# Patient Record
Sex: Female | Born: 1965 | Race: White | Hispanic: No | Marital: Married | Smoking: Former smoker
Health system: Southern US, Community
[De-identification: ages and names within clinical notes are randomized; demographics above are authoritative.]

## PROBLEM LIST (undated history)

## (undated) DIAGNOSIS — G43909 Migraine, unspecified, not intractable, without status migrainosus: Secondary | ICD-10-CM

## (undated) DIAGNOSIS — F319 Bipolar disorder, unspecified: Secondary | ICD-10-CM

## (undated) DIAGNOSIS — M1990 Unspecified osteoarthritis, unspecified site: Secondary | ICD-10-CM

## (undated) DIAGNOSIS — J302 Other seasonal allergic rhinitis: Secondary | ICD-10-CM

## (undated) DIAGNOSIS — J45909 Unspecified asthma, uncomplicated: Secondary | ICD-10-CM

## (undated) HISTORY — PX: ABLATION: SHX5711

## (undated) HISTORY — PX: KNEE SURGERY: SHX244

---

## 1997-09-09 ENCOUNTER — Encounter (HOSPITAL_COMMUNITY): Admission: RE | Admit: 1997-09-09 | Discharge: 1997-12-08 | Payer: Self-pay

## 1997-10-29 ENCOUNTER — Ambulatory Visit (HOSPITAL_COMMUNITY): Admission: RE | Admit: 1997-10-29 | Discharge: 1997-10-29 | Payer: Self-pay | Admitting: Psychiatry

## 2003-08-12 ENCOUNTER — Other Ambulatory Visit: Admission: RE | Admit: 2003-08-12 | Discharge: 2003-08-12 | Payer: Self-pay | Admitting: Obstetrics and Gynecology

## 2003-08-12 ENCOUNTER — Other Ambulatory Visit: Admission: RE | Admit: 2003-08-12 | Discharge: 2003-08-12 | Payer: Self-pay | Admitting: Student

## 2004-02-18 ENCOUNTER — Inpatient Hospital Stay (HOSPITAL_COMMUNITY): Admission: AD | Admit: 2004-02-18 | Discharge: 2004-02-18 | Payer: Self-pay | Admitting: Obstetrics & Gynecology

## 2004-02-26 ENCOUNTER — Inpatient Hospital Stay (HOSPITAL_COMMUNITY): Admission: AD | Admit: 2004-02-26 | Discharge: 2004-02-29 | Payer: Self-pay | Admitting: Obstetrics and Gynecology

## 2004-03-01 ENCOUNTER — Encounter: Admission: RE | Admit: 2004-03-01 | Discharge: 2004-03-31 | Payer: Self-pay | Admitting: Obstetrics and Gynecology

## 2004-04-11 ENCOUNTER — Other Ambulatory Visit: Admission: RE | Admit: 2004-04-11 | Discharge: 2004-04-11 | Payer: Self-pay | Admitting: Obstetrics and Gynecology

## 2005-07-23 ENCOUNTER — Other Ambulatory Visit: Admission: RE | Admit: 2005-07-23 | Discharge: 2005-07-23 | Payer: Self-pay | Admitting: Obstetrics and Gynecology

## 2008-02-06 ENCOUNTER — Ambulatory Visit: Payer: Self-pay | Admitting: Gastroenterology

## 2011-01-03 ENCOUNTER — Emergency Department: Payer: Self-pay | Admitting: Emergency Medicine

## 2011-01-06 ENCOUNTER — Observation Stay: Payer: Self-pay | Admitting: Internal Medicine

## 2012-10-21 ENCOUNTER — Ambulatory Visit: Payer: Self-pay | Admitting: Gastroenterology

## 2012-11-24 ENCOUNTER — Emergency Department: Payer: Self-pay | Admitting: Emergency Medicine

## 2012-12-19 IMAGING — US US CAROTID DUPLEX BILAT
1 series · 17 of 24 positions shown · non-contrast
Comparison: None

REASON FOR EXAM: dizziness
COMMENTS:

PROCEDURE:     US  - US CAROTID DOPPLER BILATERAL  - January 06, 2011  [DATE]
RESULT:     Indication: Dizziness
TECHNIQUE: Gray-scale, color Doppler, and spectral Doppler images were
obtained of the extracranial carotid artery systems and vertebral arteries
in the neck.

[Series 1: us carotid duplex bilat · 17 of 53 slices shown]
[im 1/53]
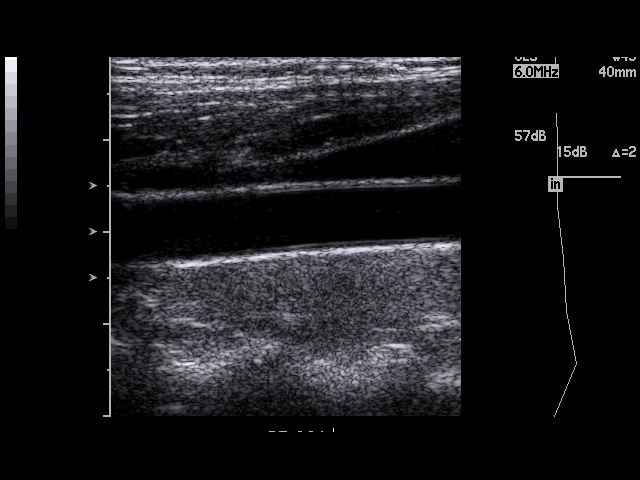
[im 5/53]
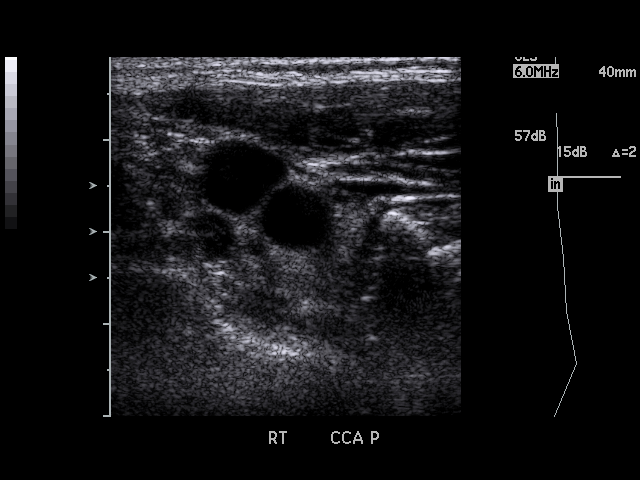
[im 7/53]
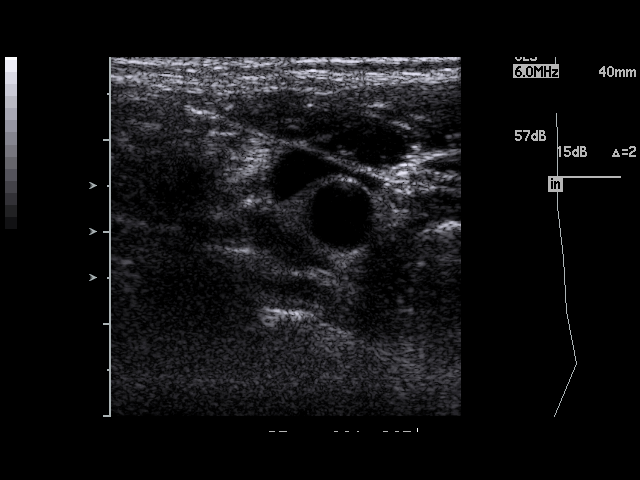
[im 10/53]
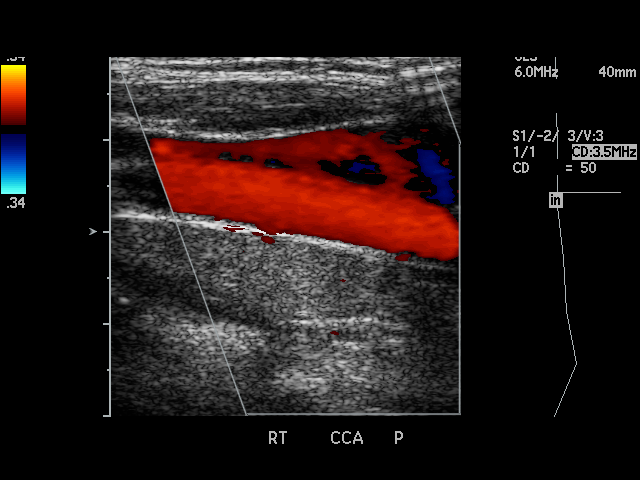
[im 14/53]
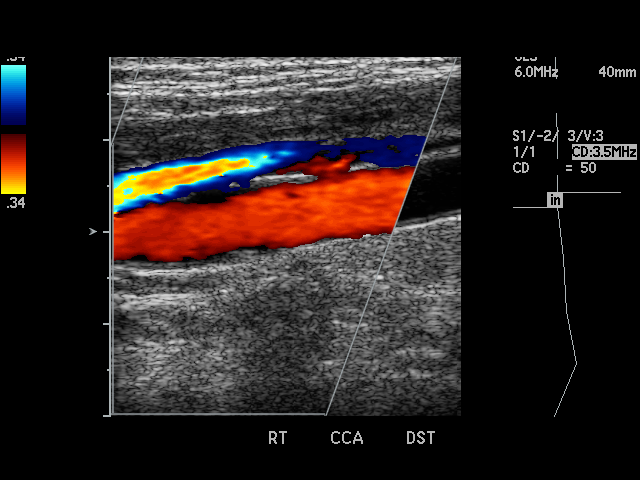
[im 16/53]
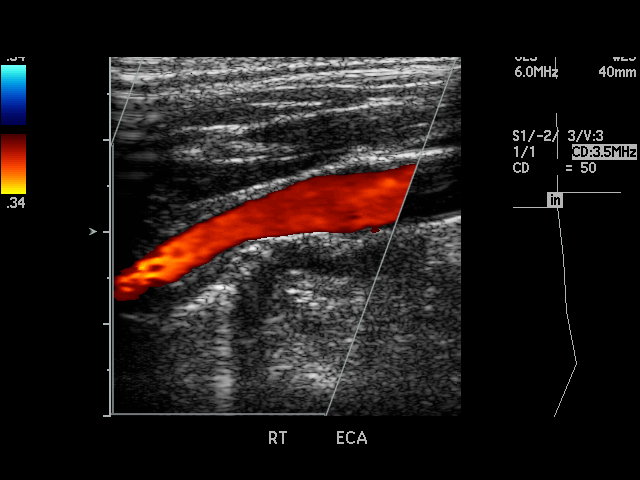
[im 21/53]
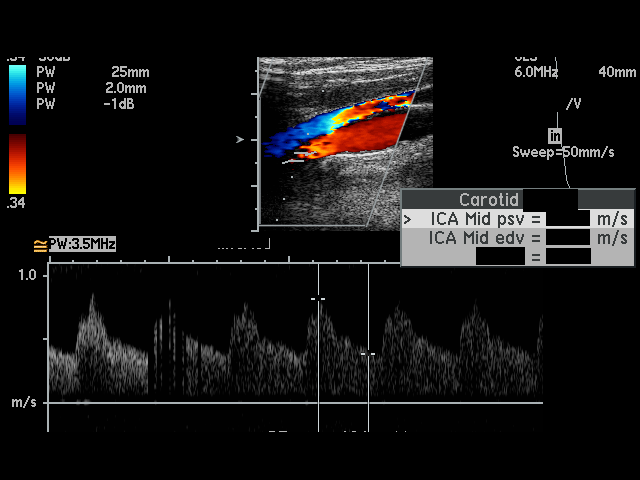
[im 23/53]
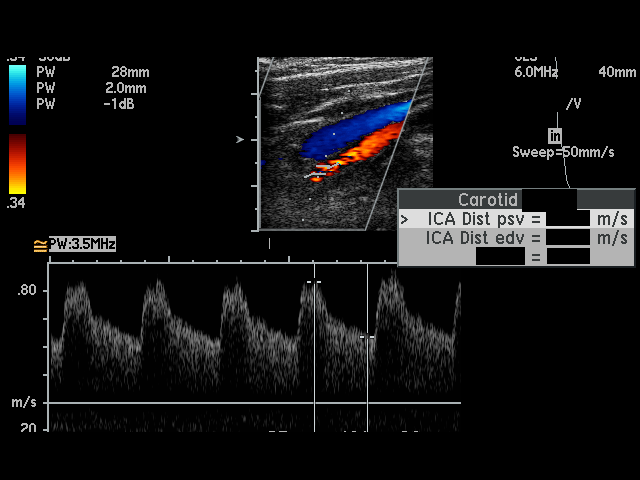
[im 28/53]
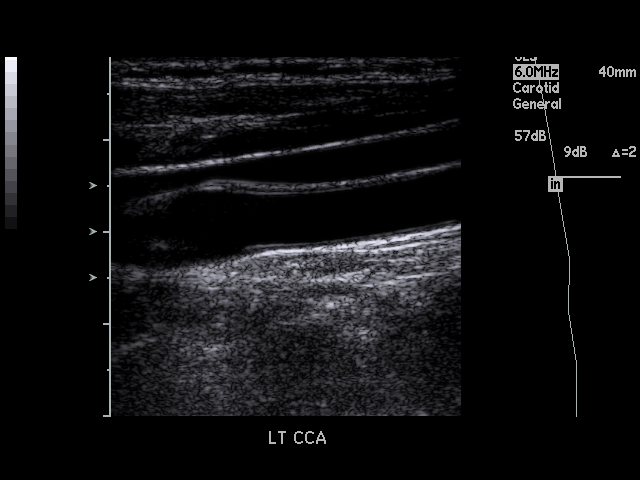
[im 30/53]
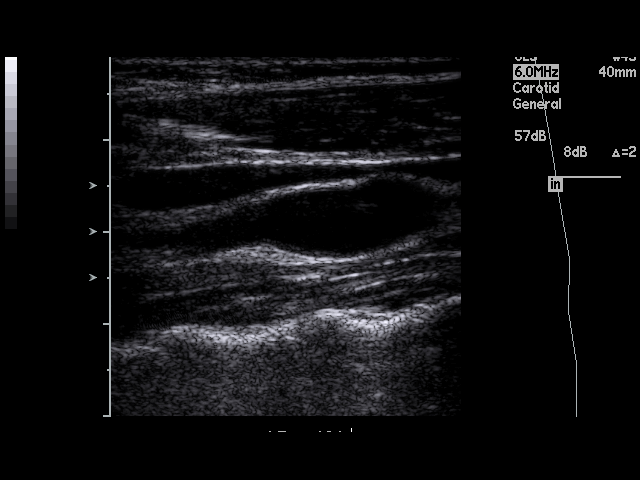
[im 32/53]
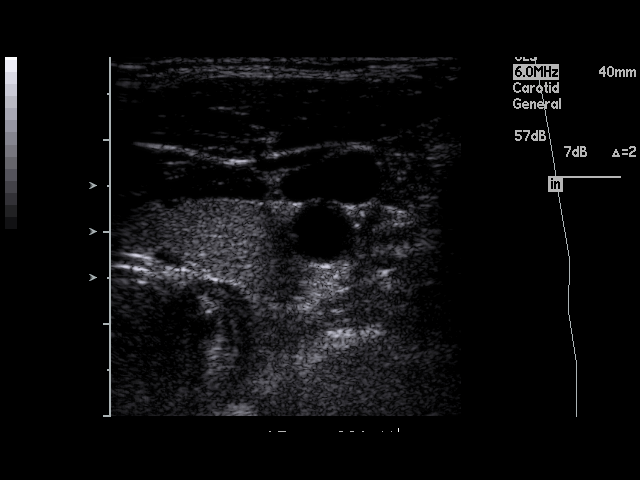
[im 37/53]
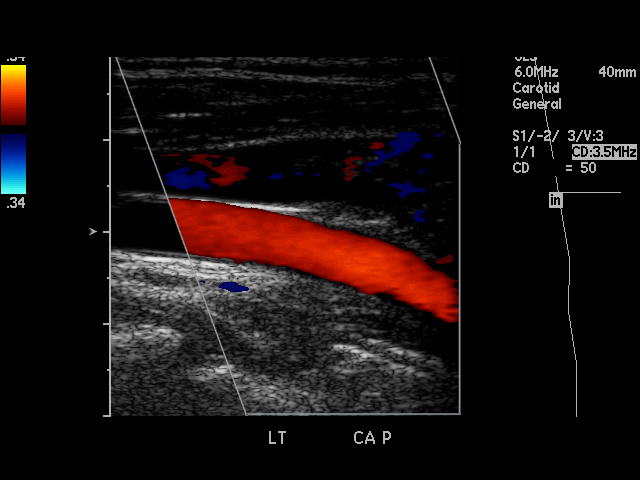
[im 39/53]
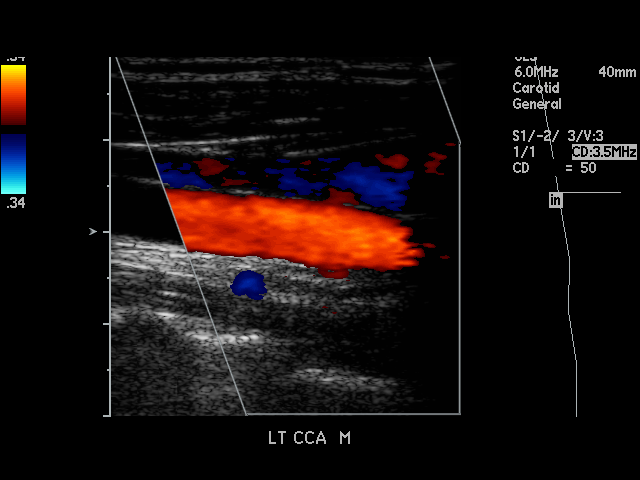
[im 43/53]
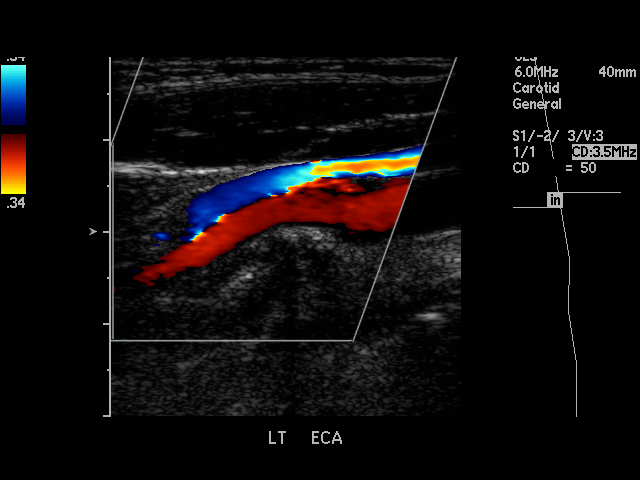
[im 46/53]
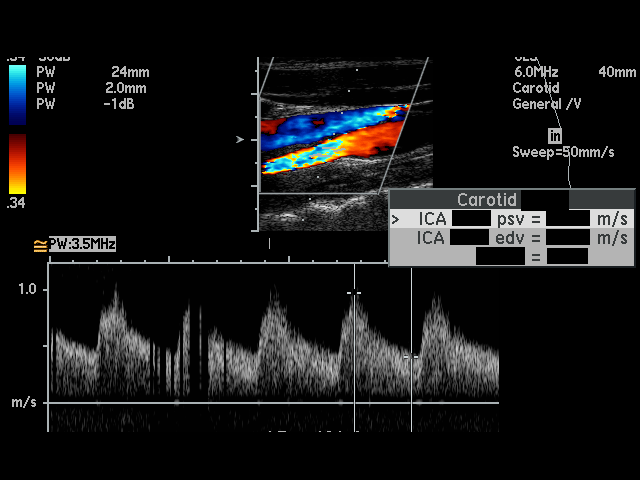
[im 48/53]
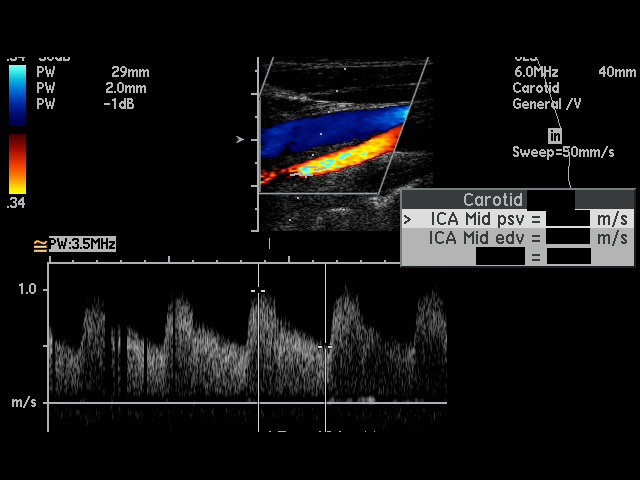
[im 53/53]
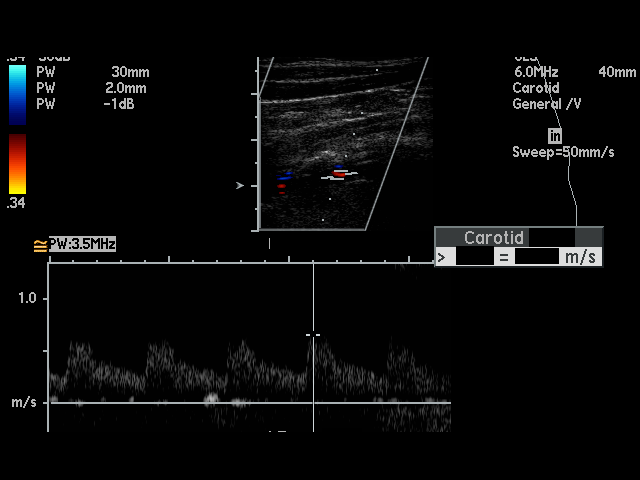

[17 of 24 positions shown; findings below may reference images not displayed]

FINDINGS: On the right, there is no significant atherosclerotic plaque.. Maximum peak
systolic velocity in the right CCA is 84 cm/second. Maximum peak systolic
velocity in the right ICA is 87 cm/second. Maximum peak systolic velocity in
the right ECA is 63 cm/second. The right ICA/CCA ratio is 1.04. This
corresponds to a stenosis of less than 50 %. Antegrade blood flow is
documented in the right vertebral artery.

On the left, there is no significant atherosclerotic plaque. Maximum peak
systolic velocity in the left CCA is 84 cm/second. Maximum peak systolic
velocity in the left ICA is 100 cm/second. Maximum peak systolic velocity in
the left ECA is 66 cm/second. The left ICA/CCA ratio is 1.19. This
corresponds to a stenosis of less than 50 %. Antegrade blood flow is
documented in the left vertebral artery.
IMPRESSION: 1. No hemodynamically significant carotid artery stenosis.

## 2015-05-04 ENCOUNTER — Ambulatory Visit: Payer: Self-pay | Admitting: Physician Assistant

## 2015-05-04 ENCOUNTER — Encounter: Payer: Self-pay | Admitting: Physician Assistant

## 2015-05-04 VITALS — BP 100/65 | HR 84 | Temp 97.9°F

## 2015-05-04 DIAGNOSIS — J069 Acute upper respiratory infection, unspecified: Secondary | ICD-10-CM

## 2015-05-04 MED ORDER — ALBUTEROL SULFATE HFA 108 (90 BASE) MCG/ACT IN AERS: 2.0000 | INHALATION_SPRAY | Freq: Four times a day (QID) | RESPIRATORY_TRACT | Status: DC | PRN

## 2015-05-04 MED ORDER — BENZONATATE 200 MG PO CAPS: 200.0000 mg | ORAL_CAPSULE | Freq: Three times a day (TID) | ORAL | Status: DC | PRN

## 2015-05-04 MED ORDER — CEFDINIR 300 MG PO CAPS: 300.0000 mg | ORAL_CAPSULE | Freq: Two times a day (BID) | ORAL | Status: DC

## 2015-05-04 NOTE — Progress Notes (Signed)
S: C/o runny nose and congestion for 7-10 days, no fever, chills, cp/sob, v/d; mucus was yellow, cough is sporadic, voice is hoarse; daughter had same sx  Using otc meds: robitussin  O: PE: perrl eomi, normocephalic, tms dull, nasal mucosa red and swollen on r side, boggy on left, throat injected, neck supple no lymph, lungs c t a, cv rrr, neuro intact, cough is dry  A:  Acute uri   P: omnicef 300mg  bid x 10d, albuterol inhaler, tessalon perls; drink fluids, continue regular meds , use otc meds of choice, return if not improving in 5 days, return earlier if worsening

## 2015-05-18 ENCOUNTER — Ambulatory Visit: Payer: Self-pay | Admitting: Physician Assistant

## 2015-05-18 ENCOUNTER — Encounter: Payer: Self-pay | Admitting: Physician Assistant

## 2015-05-18 VITALS — BP 100/60 | HR 99 | Temp 98.5°F

## 2015-05-18 DIAGNOSIS — H6981 Other specified disorders of Eustachian tube, right ear: Secondary | ICD-10-CM

## 2015-05-18 MED ORDER — PREDNISONE 10 MG PO TABS: 30.0000 mg | ORAL_TABLET | Freq: Every day | ORAL | Status: DC

## 2015-05-18 NOTE — Progress Notes (Signed)
S:  C/o ears popping and being stopped up, no drainage from ears, no fever/chills, no cough or congestion, some sinus pressure, remainder ros neg Using otc meds without relief  O:  Vitals wnl, nad, tms dull b/l, nasal mucosa swollen, throat wnl, neck supple no lymph, lungs c t a, cv rrr, neuro intact  A: acute eustachean tube dysfunction  P: flonase, sudafed, prednisone 30mg qd x 3d, return if not improving in 3 to 5 days, return earlier if worsening  

## 2015-10-03 ENCOUNTER — Emergency Department
Admission: EM | Admit: 2015-10-03 | Discharge: 2015-10-03 | Disposition: A | Discharge status: Home / Self Care | Payer: Worker's Compensation | Attending: Emergency Medicine | Admitting: Emergency Medicine

## 2015-10-03 ENCOUNTER — Encounter: Payer: Self-pay | Admitting: Emergency Medicine

## 2015-10-03 DIAGNOSIS — Y92410 Unspecified street and highway as the place of occurrence of the external cause: Secondary | ICD-10-CM | POA: Diagnosis not present

## 2015-10-03 DIAGNOSIS — Y9389 Activity, other specified: Secondary | ICD-10-CM | POA: Insufficient documentation

## 2015-10-03 DIAGNOSIS — Z79899 Other long term (current) drug therapy: Secondary | ICD-10-CM | POA: Diagnosis not present

## 2015-10-03 DIAGNOSIS — S39011A Strain of muscle, fascia and tendon of abdomen, initial encounter: Secondary | ICD-10-CM | POA: Insufficient documentation

## 2015-10-03 DIAGNOSIS — R1084 Generalized abdominal pain: Secondary | ICD-10-CM | POA: Diagnosis present

## 2015-10-03 DIAGNOSIS — F319 Bipolar disorder, unspecified: Secondary | ICD-10-CM | POA: Diagnosis not present

## 2015-10-03 DIAGNOSIS — Y999 Unspecified external cause status: Secondary | ICD-10-CM | POA: Insufficient documentation

## 2015-10-03 DIAGNOSIS — M549 Dorsalgia, unspecified: Secondary | ICD-10-CM | POA: Insufficient documentation

## 2015-10-03 DIAGNOSIS — Z7952 Long term (current) use of systemic steroids: Secondary | ICD-10-CM | POA: Insufficient documentation

## 2015-10-03 DIAGNOSIS — J45909 Unspecified asthma, uncomplicated: Secondary | ICD-10-CM | POA: Diagnosis not present

## 2015-10-03 HISTORY — DX: Migraine, unspecified, not intractable, without status migrainosus: G43.909

## 2015-10-03 HISTORY — DX: Bipolar disorder, unspecified: F31.9

## 2015-10-03 HISTORY — DX: Other seasonal allergic rhinitis: J30.2

## 2015-10-03 HISTORY — DX: Unspecified osteoarthritis, unspecified site: M19.90

## 2015-10-03 HISTORY — DX: Unspecified asthma, uncomplicated: J45.909

## 2015-10-03 MED ORDER — TRAMADOL HCL 50 MG PO TABS: 50.0000 mg | ORAL_TABLET | Freq: Four times a day (QID) | ORAL | Status: DC | PRN

## 2015-10-03 MED ORDER — DIAZEPAM 5 MG PO TABS
5.0000 mg | ORAL_TABLET | Freq: Three times a day (TID) | ORAL | Status: DC | PRN
Start: 2015-10-03 — End: 2016-03-02

## 2015-10-03 NOTE — Discharge Instructions (Signed)
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
## 2016-03-02 ENCOUNTER — Ambulatory Visit: Payer: Self-pay | Admitting: Physician Assistant

## 2016-03-02 ENCOUNTER — Encounter: Payer: Self-pay | Admitting: Physician Assistant

## 2016-03-02 VITALS — BP 124/58 | HR 84 | Temp 97.7°F

## 2016-03-02 DIAGNOSIS — F319 Bipolar disorder, unspecified: Secondary | ICD-10-CM | POA: Insufficient documentation

## 2016-03-02 DIAGNOSIS — J4520 Mild intermittent asthma, uncomplicated: Secondary | ICD-10-CM

## 2016-03-02 MED ORDER — METHYLPREDNISOLONE 4 MG PO TBPK: ORAL_TABLET | ORAL | 0 refills | Status: DC

## 2016-03-02 MED ORDER — MONTELUKAST SODIUM 10 MG PO TABS: 10.0000 mg | ORAL_TABLET | Freq: Every day | ORAL | 3 refills | Status: DC

## 2016-03-02 NOTE — Progress Notes (Signed)
S: C/o cough  with wheezing, chest is sore from coughing, states a coworker has had fresh flowers at her desk and it set her asthma off, using inhaler with a little relief, denies fever, chills, cough is dry and hacking; keeping pt awake at night;  denies cardiac type chest pain or sob, v/d, abd pain Remainder ros neg  O: vitals wnl, nad, tms clear, throat injected, neck supple no lymph, lungs c t a, cv rrr, neuro intact  A:  Acute bronchitis   P:  rx medication: medrol dose pack, singulair, use otc meds, tylenol or motrin as needed for fever/chills, return if not better in 3 -5 days, return earlier if worsening

## 2016-06-07 ENCOUNTER — Ambulatory Visit: Payer: Self-pay | Admitting: Physician Assistant

## 2016-06-07 VITALS — BP 120/57 | HR 91 | Temp 98.3°F

## 2016-06-07 DIAGNOSIS — J019 Acute sinusitis, unspecified: Secondary | ICD-10-CM

## 2016-06-07 DIAGNOSIS — J069 Acute upper respiratory infection, unspecified: Secondary | ICD-10-CM

## 2016-06-07 MED ORDER — LEVOFLOXACIN 500 MG PO TABS: 500.0000 mg | ORAL_TABLET | Freq: Every day | ORAL | 0 refills | Status: DC

## 2016-06-07 NOTE — Progress Notes (Signed)
S 4 d hx of scratchy throat ,increased pnd , cough productive of green yellow mucous, body aches, frontal HA worse with bending over, ears stopped up, pressure , low grade fever , h/0 asthma and has used her inhalers due to tightness  O/ VSS mildly ill NAD ENT  Nasal turbinates red ,swollen with exudate , throat injected neck supple without nodes, heart rsr lungs clear  A/ URI ,rhinosinusitis Asthma  P . Supportive measures discussed. Follow up prn not improving  She has inhalers on hand.  rx levaquin 500  One qd # 10 . Hydration encouraged .

## 2016-06-27 ENCOUNTER — Other Ambulatory Visit: Payer: Self-pay | Admitting: Physician Assistant

## 2016-10-26 ENCOUNTER — Encounter: Payer: Self-pay | Admitting: Physician Assistant

## 2016-10-26 ENCOUNTER — Ambulatory Visit: Payer: Self-pay | Admitting: Physician Assistant

## 2016-10-26 VITALS — BP 119/70 | HR 84 | Temp 98.5°F

## 2016-10-26 DIAGNOSIS — I889 Nonspecific lymphadenitis, unspecified: Secondary | ICD-10-CM

## 2016-10-26 MED ORDER — CEPHALEXIN 500 MG PO CAPS: 500.0000 mg | ORAL_CAPSULE | Freq: Three times a day (TID) | ORAL | 0 refills | Status: DC

## 2016-10-26 NOTE — Progress Notes (Signed)
S: c/o large knot behind left ear, her hair dresser noticed it, did put a warm compress on it and ?if its a little better, also has a ?node on left side of her back, has been there about 6 months-1 year; thought it was a fat deposit, also has a reddish rash on her right breast, had the "blue light" treatment at the dermatologist and this showed up later, denies fever/chills/cp/sob/abd pain/cough  O: vitals wnl, nad, postauricular area with large node noted, no cuts or lesions in scalp, no pustules noted, node is a little tender to palp, neck supple no lymph noted, no axillary lymph nodes noted, area on left side of back approximate to si joint is swollen, tender mobile, feels like a swollen node, n/v intact  A: lymphadenitis  P: keflex 500mg  tid, refer to surgery for eval of swollen nodes

## 2016-10-30 ENCOUNTER — Encounter: Payer: Self-pay | Admitting: *Deleted

## 2016-10-30 NOTE — Progress Notes (Signed)
Patient has been referred to see Dr. Jamal Collin at [facility] on 11/01/16 @ 8:45am.  Patient has been notified and has accepted the appointment.

## 2016-11-01 ENCOUNTER — Ambulatory Visit (INDEPENDENT_AMBULATORY_CARE_PROVIDER_SITE_OTHER): Payer: Managed Care, Other (non HMO) | Admitting: General Surgery

## 2016-11-01 ENCOUNTER — Encounter: Payer: Self-pay | Admitting: General Surgery

## 2016-11-01 VITALS — BP 120/78 | HR 73 | Resp 12 | Ht 61.0 in | Wt 140.0 lb

## 2016-11-01 DIAGNOSIS — D170 Benign lipomatous neoplasm of skin and subcutaneous tissue of head, face and neck: Secondary | ICD-10-CM | POA: Diagnosis not present

## 2016-11-01 DIAGNOSIS — D171 Benign lipomatous neoplasm of skin and subcutaneous tissue of trunk: Secondary | ICD-10-CM

## 2016-11-01 DIAGNOSIS — D1779 Benign lipomatous neoplasm of other sites: Secondary | ICD-10-CM

## 2016-11-01 NOTE — Patient Instructions (Signed)
The patient is aware to call back for any questions or concerns. Patient to return as needed. 

## 2016-11-01 NOTE — Progress Notes (Signed)
Patient ID: Kayla Knox, female   DOB: April 13, 1966, 51 y.o.   MRN: [id number]  Chief Complaint  Patient presents with  . Other    lymphadenitis     HPI Kayla Knox is a 51 y.o. female here today for a evaluation of lymphadenitis. Patient states she has noticed some knots behind her ear and on her left side of her back about six months ago. She recently had a rash on her right breast. Patient saw her OBGYN on Monday they gave her a cream and the rash is almost gone.  HPI  Past Medical History:  Diagnosis Date  . Arthritis   . Asthma   . Bipolar 1 disorder ([place])   . Migraines   . Seasonal allergies     Past Surgical History:  Procedure Laterality Date  . KNEE SURGERY      Family History  Problem Relation Age of Onset  . Colon cancer Maternal Uncle     Social History Social History  Substance Use Topics  . Smoking status: Never Smoker  . Smokeless tobacco: Never Used  . Alcohol use No    Allergies  Allergen Reactions  . Promethazine Other (See Comments)    Seizure, hospitalized because of this medication   . Azithromycin Rash  . Penicillin G Rash    Current Outpatient Prescriptions  Medication Sig Dispense Refill  . albuterol (PROVENTIL HFA;VENTOLIN HFA) 108 (90 BASE) MCG/ACT inhaler Inhale 2 puffs into the lungs every 6 (six) hours as needed for wheezing or shortness of breath. 1 Inhaler 6  . cephALEXin (KEFLEX) 500 MG capsule Take 1 capsule (500 mg total) by mouth 3 (three) times daily. 21 capsule 0  . divalproex (DEPAKOTE ER) 250 MG 24 hr tablet Take 250 mg by mouth at bedtime.  3  . lamoTRIgine (LAMICTAL) 100 MG tablet Take by mouth.    . levofloxacin (LEVAQUIN) 500 MG tablet Take 1 tablet (500 mg total) by mouth daily. 10 tablet 0  . montelukast (SINGULAIR) 10 MG tablet TAKE 1 TABLET (10 MG TOTAL) BY MOUTH AT BEDTIME. 30 tablet 3   No current facility-administered medications for this visit.     Review of Systems Review of Systems  Constitutional:  Negative.   Respiratory: Negative.   Cardiovascular: Negative.     Blood pressure 120/78, pulse 73, resp. rate 12, height 5\' 1"  (1.549 m), weight 140 lb (63.5 kg).  Physical Exam Physical Exam  Constitutional: She is oriented to person, place, and time. She appears well-developed and well-nourished.  Eyes: Conjunctivae are normal.  Neck: Neck supple.    Cardiovascular: Normal rate and regular rhythm.   Lymphadenopathy:    She has no cervical adenopathy.  Neurological: She is alert and oriented to person, place, and time.  Skin: Skin is warm and dry.       Data Reviewed Notes reviewed   Assessment    Skin cyst behind left ear Multiple small lipomas as noted above.  None of these are symptomatic. Pt advised. To call for any associated pain, redness or rapid enlargement      Plan    Patient to return as needed.   HPI, Physical Exam, Assessment and Plan have been scribed under the direction and in the presence of Mckinley Jewel, MD  Gaspar Cola, CMA     I have completed the exam and reviewed the above documentation for accuracy and completeness.  I agree with the above.  Haematologist has been used and any errors  in dictation or transcription are unintentional.  Seeplaputhur G. Jamal Collin, M.D., F.A.C.S.    Junie Panning G 11/01/2016, 9:23 AM

## 2016-12-17 ENCOUNTER — Ambulatory Visit: Payer: Self-pay | Admitting: Physician Assistant

## 2016-12-17 ENCOUNTER — Ambulatory Visit
Admission: RE | Admit: 2016-12-17 | Discharge: 2016-12-17 | Disposition: A | Discharge status: Home / Self Care | Payer: Managed Care, Other (non HMO) | Source: Ambulatory Visit | Attending: Physician Assistant | Admitting: Physician Assistant

## 2016-12-17 ENCOUNTER — Encounter: Payer: Self-pay | Admitting: Physician Assistant

## 2016-12-17 VITALS — BP 119/70 | HR 94 | Temp 98.5°F | Resp 16

## 2016-12-17 DIAGNOSIS — M25552 Pain in left hip: Secondary | ICD-10-CM

## 2016-12-17 DIAGNOSIS — W19XXXA Unspecified fall, initial encounter: Secondary | ICD-10-CM | POA: Diagnosis not present

## 2016-12-17 MED ORDER — MELOXICAM 15 MG PO TABS: 15.0000 mg | ORAL_TABLET | Freq: Every day | ORAL | 0 refills | Status: DC

## 2016-12-17 MED ORDER — BACLOFEN 10 MG PO TABS: 10.0000 mg | ORAL_TABLET | Freq: Three times a day (TID) | ORAL | 0 refills | Status: DC
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
## 2016-12-31 ENCOUNTER — Encounter: Payer: Self-pay | Admitting: Physician Assistant

## 2016-12-31 ENCOUNTER — Ambulatory Visit: Payer: Self-pay | Admitting: Physician Assistant

## 2016-12-31 VITALS — BP 110/70 | HR 87 | Temp 97.8°F | Resp 16

## 2016-12-31 DIAGNOSIS — J0120 Acute ethmoidal sinusitis, unspecified: Secondary | ICD-10-CM

## 2016-12-31 MED ORDER — SULFAMETHOXAZOLE-TRIMETHOPRIM 800-160 MG PO TABS: 1.0000 | ORAL_TABLET | Freq: Two times a day (BID) | ORAL | 0 refills | Status: DC

## 2016-12-31 MED ORDER — PSEUDOEPH-BROMPHEN-DM 30-2-10 MG/5ML PO SYRP: 5.0000 mL | ORAL_SOLUTION | Freq: Four times a day (QID) | ORAL | 0 refills | Status: DC | PRN

## 2016-12-31 MED ORDER — MONTELUKAST SODIUM 10 MG PO TABS: 10.0000 mg | ORAL_TABLET | Freq: Every day | ORAL | 3 refills | Status: DC

## 2016-12-31 NOTE — Progress Notes (Signed)
    Subjective: Sinus congestion/mediation refill    Patient ID: Kayla Knox, female    DOB: 04-12-1966, 51 y.o.   MRN: [id number]  HPI Patient c/o right sinus and ear pressure for one week. Intermitting fever. Also c/o non-productive cough. States increase use of Inhaler. Request refill of Singulair.   Review of Systems Bipolar    Objective:   Physical Exam Right maxillary guarading and edematous right TM. Post nasal drainage. Non-productive cough. Negative cervical adenopathy and neck is supple. Lungs CTA and Heart RRR.       Assessment & Plan:Sinusitis and medication refill  Bactrim DS, Bromfed DM, and Singulair.  Follow up one week if no improvement.

## 2017-01-12 ENCOUNTER — Other Ambulatory Visit: Payer: Self-pay | Admitting: Physician Assistant

## 2017-01-14 NOTE — Telephone Encounter (Signed)
Med refill for mobic approved 

## 2017-08-02 ENCOUNTER — Emergency Department
Admission: EM | Admit: 2017-08-02 | Discharge: 2017-08-02 | Disposition: A | Discharge status: Home / Self Care | Payer: BC Managed Care – PPO | Attending: Emergency Medicine | Admitting: Emergency Medicine

## 2017-08-02 ENCOUNTER — Other Ambulatory Visit: Payer: Self-pay

## 2017-08-02 DIAGNOSIS — K219 Gastro-esophageal reflux disease without esophagitis: Secondary | ICD-10-CM | POA: Insufficient documentation

## 2017-08-02 DIAGNOSIS — F419 Anxiety disorder, unspecified: Secondary | ICD-10-CM | POA: Insufficient documentation

## 2017-08-02 DIAGNOSIS — J45909 Unspecified asthma, uncomplicated: Secondary | ICD-10-CM | POA: Diagnosis not present

## 2017-08-02 DIAGNOSIS — T7840XA Allergy, unspecified, initial encounter: Secondary | ICD-10-CM

## 2017-08-02 DIAGNOSIS — R0989 Other specified symptoms and signs involving the circulatory and respiratory systems: Secondary | ICD-10-CM | POA: Insufficient documentation

## 2017-08-02 MED ORDER — PREDNISONE 20 MG PO TABS
60.0000 mg | ORAL_TABLET | Freq: Once | ORAL | Status: AC
Start: 2017-08-02 — End: 2017-08-02
  Administered 2017-08-02: 60 mg via ORAL
  Filled 2017-08-02: qty 3

## 2017-08-02 MED ORDER — DIPHENHYDRAMINE HCL 25 MG PO CAPS
25.0000 mg | ORAL_CAPSULE | Freq: Once | ORAL | Status: DC
  Filled 2017-08-02: qty 1

## 2017-08-02 MED ORDER — PREDNISONE 20 MG PO TABS
ORAL_TABLET | ORAL | Status: AC
  Filled 2017-08-02: qty 2

## 2017-08-02 MED ORDER — PREDNISONE 10 MG (21) PO TBPK: ORAL_TABLET | Freq: Every day | ORAL | 0 refills | Status: DC

## 2017-08-02 NOTE — ED Notes (Signed)
Pt presents today for allergic reaction. Started amoxicillin yesterday

## 2017-08-02 NOTE — ED Triage Notes (Signed)
Pt states she took first dose of amoxicillin within the past 53min and feels like her throat is swollen, states it does not feel right when I swallow. Unsure if she has ever had that med before.

## 2017-08-02 NOTE — ED Notes (Signed)
No swelling seen to throat.

## 2017-08-02 NOTE — ED Provider Notes (Signed)
[facility] Emergency Department Provider Note       Time seen: ----------------------------------------- 6:10 PM on 08/02/2017 -----------------------------------------   I have reviewed the triage vital signs and the nursing notes.  HISTORY   Chief Complaint Allergic Reaction    HPI Kayla Knox is a 52 y.o. female with a history of arthritis, asthma, bipolar disorder, migraines and seasonal allergies who presents to the ED for possible allergic reaction.  Patient states she took her first dose of amoxicillin which she was prescribed for sinusitis.  She states within the last 20 minutes she has felt like her throat was closing but otherwise denies any allergic symptoms.  She was allergic to penicillin as a child.  Past Medical History:  Diagnosis Date  . Arthritis   . Asthma   . Bipolar 1 disorder ([place])   . Migraines   . Seasonal allergies     Patient Active Problem List   Diagnosis Date Noted  . Bipolar 1 disorder ([place]) 03/02/2016    Past Surgical History:  Procedure Laterality Date  . KNEE SURGERY      Allergies Promethazine; Azithromycin; and Penicillin g  Social History Social History   Tobacco Use  . Smoking status: Never Smoker  . Smokeless tobacco: Never Used  Substance Use Topics  . Alcohol use: No    Alcohol/week: 0.0 oz  . Drug use: No    Review of Systems Constitutional: Negative for fever. Eyes: Negative for vision changes ENT: Positive for dysphasia Cardiovascular: Negative for chest pain. Respiratory: Negative for shortness of breath. Gastrointestinal: Negative for abdominal pain, vomiting and diarrhea. Genitourinary: Negative for dysuria. Musculoskeletal: Negative for back pain. Skin: Negative for rash. Neurological: Negative for headaches, focal weakness or numbness.  All systems negative/normal/unremarkable except as stated in the HPI  ____________________________________________   PHYSICAL  EXAM:  VITAL SIGNS: ED Triage Vitals  Enc Vitals Group     BP 08/02/17 1755 (!) 165/88     Pulse Rate 08/02/17 1755 (!) 109     Resp 08/02/17 1755 18     Temp 08/02/17 1755 98.3 F (36.8 C)     Temp Source 08/02/17 1755 Oral     SpO2 08/02/17 1755 100 %     Weight 08/02/17 1802 145 lb (65.8 kg)     Height 08/02/17 1802 5' 1.5" (1.562 m)     Head Circumference --      Peak Flow --      Pain Score --      Pain Loc --      Pain Edu? --      Excl. in [place]? --     Constitutional: Alert and oriented. Well appearing and in no distress. Eyes: Conjunctivae are normal. Normal extraocular movements. ENT   Head: Normocephalic and atraumatic.   Nose: No congestion/rhinnorhea.   Mouth/Throat: Mucous membranes are moist.  No swelling is appreciated   Neck: No stridor. Cardiovascular: Normal rate, regular rhythm. No murmurs, rubs, or gallops. Respiratory: Normal respiratory effort without tachypnea nor retractions. Breath sounds are clear and equal bilaterally. No wheezes/rales/rhonchi. Gastrointestinal: Soft and nontender. Normal bowel sounds Musculoskeletal: Nontender with normal range of motion in extremities. No lower extremity tenderness nor edema. Neurologic:  Normal speech and language. No gross focal neurologic deficits are appreciated.  Skin:  Skin is warm, dry and intact. No rash noted. Psychiatric: Mood and affect are normal. Speech and behavior are normal.  ____________________________________________  ED COURSE:  As part of my medical decision making, I  reviewed the following data within the [place] History obtained from family if available, nursing notes, old chart and ekg, as well as notes from prior ED visits. Patient presented for possible allergic reaction, we will give Benadryl and oral steroids.  The remainder of her examination is normal.  We will observe her in the ER.    Procedures ____________________________________________  DIFFERENTIAL DIAGNOSIS   Allergic reaction, anxiety, anaphylaxis, GERD  FINAL ASSESSMENT AND PLAN  Possible allergic reaction, sinusitis   Plan: Patient had presented for possible allergic reaction.  She has not had any worsening in her symptoms and is stable for outpatient follow-up.  I will advise steroids for her sinuses alone, as well as for her possible reaction.   Laurence Aly, MD   Note: This note was generated in part or whole with voice recognition software. Voice recognition is usually quite accurate but there are transcription errors that can and very often do occur. I apologize for any typographical errors that were not detected and corrected.     Earleen Newport, MD 08/02/17 [phone]

## 2017-08-02 NOTE — ED Triage Notes (Signed)
First Nurse Note:  One dose Amoxicillin taken approximately 15 minutes PTA.  Patient states she feels throat is closing up.  Voice clear and strong. Respirations regular and non labored.  NAD.

## 2018-03-19 ENCOUNTER — Ambulatory Visit
Admission: RE | Admit: 2018-03-19 | Discharge: 2018-03-19 | Disposition: A | Discharge status: Home / Self Care | Payer: BC Managed Care – PPO | Source: Ambulatory Visit | Attending: Allergy | Admitting: Allergy

## 2018-03-19 ENCOUNTER — Other Ambulatory Visit: Payer: Self-pay | Admitting: Allergy

## 2018-03-19 DIAGNOSIS — R05 Cough: Secondary | ICD-10-CM

## 2018-03-19 DIAGNOSIS — R059 Cough, unspecified: Secondary | ICD-10-CM

## 2018-04-07 ENCOUNTER — Encounter: Payer: Self-pay | Admitting: Emergency Medicine

## 2018-04-21 ENCOUNTER — Ambulatory Visit: Payer: Self-pay | Admitting: Psychiatry

## 2018-05-05 ENCOUNTER — Other Ambulatory Visit: Payer: Self-pay

## 2018-05-05 MED ORDER — DIVALPROEX SODIUM ER 250 MG PO TB24: 250.0000 mg | ORAL_TABLET | Freq: Every day | ORAL | 0 refills | Status: DC

## 2018-06-09 ENCOUNTER — Ambulatory Visit: Payer: BC Managed Care – PPO | Admitting: Psychiatry

## 2018-06-09 ENCOUNTER — Encounter: Payer: Self-pay | Admitting: Psychiatry

## 2018-06-09 DIAGNOSIS — F3181 Bipolar II disorder: Secondary | ICD-10-CM

## 2018-06-09 MED ORDER — DIVALPROEX SODIUM ER 250 MG PO TB24: 250.0000 mg | ORAL_TABLET | Freq: Every day | ORAL | 3 refills | Status: DC

## 2018-06-09 MED ORDER — LAMOTRIGINE 100 MG PO TABS: 100.0000 mg | ORAL_TABLET | Freq: Every day | ORAL | 3 refills | Status: DC

## 2018-06-09 MED ORDER — DIVALPROEX SODIUM 500 MG PO DR TAB: 1000.0000 mg | DELAYED_RELEASE_TABLET | Freq: Every day | ORAL | 3 refills | Status: DC

## 2018-06-09 NOTE — Progress Notes (Signed)
Kayla Knox [id number] Jan 08, 1966 52 y.o.  Subjective:   Patient ID:  Kayla Knox is a 52 y.o. (DOB 09-18-1965) female.  Chief Complaint:  Chief Complaint  Patient presents with  . Follow-up    Medication Management    HPI Kayla Knox presents to the office today for follow-up of bipolar disorder.  Patient reports stable mood and denies depressed or irritable moods.  Patient denies any recent difficulty with anxiety.  Patient denies difficulty with sleep initiation or maintenance. Denies appetite disturbance.  Patient reports that energy and motivation have been good.  Patient denies any difficulty with concentration.  Patient denies any suicidal ideation. A little more moody with PMDD and perimenopausal with hot flashes.  2 biggest triggers stress and not enough sleep.      Review of Systems:  Review of Systems  Respiratory: Positive for cough.   Neurological: Negative for tremors and weakness.  Psychiatric/Behavioral: Negative for agitation, behavioral problems, confusion, decreased concentration, dysphoric mood, hallucinations, self-injury, sleep disturbance and suicidal ideas. The patient is not nervous/anxious and is not hyperactive.     Medications: I have reviewed the patient's current medications.  Current Outpatient Medications  Medication Sig Dispense Refill  . Albuterol Sulfate (PROAIR RESPICLICK) 409 (90 Base) MCG/ACT AEPB Inhale into the lungs.    . divalproex (DEPAKOTE ER) 250 MG 24 hr tablet Take 1 tablet (250 mg total) by mouth at bedtime. 90 tablet 3  . divalproex (DEPAKOTE) 500 MG DR tablet Take 2 tablets (1,000 mg total) by mouth at bedtime. 180 tablet 3  . fluticasone (FLONASE) 50 MCG/ACT nasal spray Place 1 spray into both nostrils daily.    . fluticasone furoate-vilanterol (BREO ELLIPTA) 100-25 MCG/INH AEPB Inhale 1 puff into the lungs daily.    . Hypromellose (ALZAIR ALLERGY NASAL SPRAY NA) Place into the nose.    . lamoTRIgine (LAMICTAL) 100  MG tablet Take 1 tablet (100 mg total) by mouth daily. 90 tablet 3  . loratadine (CLARITIN) 10 MG tablet Take 10 mg by mouth daily.    . montelukast (SINGULAIR) 10 MG tablet TAKE 1 TABLET (10 MG TOTAL) BY MOUTH AT BEDTIME. 30 tablet 3  . albuterol (PROVENTIL HFA;VENTOLIN HFA) 108 (90 BASE) MCG/ACT inhaler Inhale 2 puffs into the lungs every 6 (six) hours as needed for wheezing or shortness of breath. (Patient not taking: Reported on 06/09/2018) 1 Inhaler 6  . baclofen (LIORESAL) 10 MG tablet Take 1 tablet (10 mg total) by mouth 3 (three) times daily. (Patient not taking: Reported on 12/31/2016) 30 each 0  . brompheniramine-pseudoephedrine-DM 30-2-10 MG/5ML syrup Take 5 mLs by mouth 4 (four) times daily as needed. (Patient not taking: Reported on 06/09/2018) 120 mL 0  . meloxicam (MOBIC) 15 MG tablet TAKE 1 TABLET BY MOUTH EVERY DAY (Patient not taking: Reported on 06/09/2018) 30 tablet 12  . predniSONE (STERAPRED UNI-PAK 21 TAB) 10 MG (21) TBPK tablet Take by mouth daily. Dispense steroid taper pack as directed (Patient not taking: Reported on 06/09/2018) 21 tablet 0  . sulfamethoxazole-trimethoprim (BACTRIM DS,SEPTRA DS) 800-160 MG tablet Take 1 tablet by mouth 2 (two) times daily. (Patient not taking: Reported on 06/09/2018) 20 tablet 0   No current facility-administered medications for this visit.     Medication Side Effects: None  Allergies:  Allergies  Allergen Reactions  . Promethazine Other (See Comments)    Seizure, hospitalized because of this medication   . Azithromycin Rash  . Penicillin G Rash    Past Medical History:  Diagnosis Date  . Arthritis   . Asthma   . Bipolar 1 disorder ([place])   . Migraines   . Seasonal allergies     Family History  Problem Relation Age of Onset  . Colon cancer Maternal Uncle     Social History   Socioeconomic History  . Marital status: Married    Spouse name: Not on file  . Number of children: Not on file  . Years of education: Not on  file  . Highest education level: Not on file  Occupational History  . Not on file  Social Needs  . Financial resource strain: Not on file  . Food insecurity:    Worry: Not on file    Inability: Not on file  . Transportation needs:    Medical: Not on file    Non-medical: Not on file  Tobacco Use  . Smoking status: Never Smoker  . Smokeless tobacco: Never Used  Substance and Sexual Activity  . Alcohol use: No    Alcohol/week: 0.0 standard drinks  . Drug use: No  . Sexual activity: Not on file  Lifestyle  . Physical activity:    Days per week: Not on file    Minutes per session: Not on file  . Stress: Not on file  Relationships  . Social connections:    Talks on phone: Not on file    Gets together: Not on file    Attends religious service: Not on file    Active member of club or organization: Not on file    Attends meetings of clubs or organizations: Not on file    Relationship status: Not on file  . Intimate partner violence:    Fear of current or ex partner: Not on file    Emotionally abused: Not on file    Physically abused: Not on file    Forced sexual activity: Not on file  Other Topics Concern  . Not on file  Social History Narrative  . Not on file    Past Medical History, Surgical history, Social history, and Family history were reviewed and updated as appropriate.   Please see review of systems for further details on the patient's review from today.   Objective:   Physical Exam:  There were no vitals taken for this visit.  Physical Exam Constitutional:      General: She is not in acute distress.    Appearance: She is well-developed.  Musculoskeletal:        General: No deformity.  Neurological:     Mental Status: She is alert and oriented to person, place, and time.     Motor: No tremor.     Coordination: Coordination normal.     Gait: Gait normal.  Psychiatric:        Attention and Perception: Attention and perception normal.        Mood and  Affect: Mood is not anxious or depressed. Affect is not labile, blunt, angry or inappropriate.        Speech: Speech normal.        Behavior: Behavior normal.        Thought Content: Thought content normal. Thought content does not include homicidal or suicidal ideation. Thought content does not include homicidal or suicidal plan.        Cognition and Memory: Cognition normal.        Judgment: Judgment normal.     Comments: Insight intact. No auditory or visual hallucinations. No delusions.  Lab Review:  No results found for: NA, K, CL, CO2, GLUCOSE, BUN, CREATININE, CALCIUM, PROT, ALBUMIN, AST, ALT, ALKPHOS, BILITOT, GFRNONAA, GFRAA  No results found for: WBC, RBC, HGB, HCT, PLT, MCV, MCH, MCHC, RDW, LYMPHSABS, MONOABS, EOSABS, BASOSABS  No results found for: POCLITH, LITHIUM   No results found for: PHENYTOIN, PHENOBARB, VALPROATE, CBMZ   .res Assessment: Plan:    Bipolar II disorder, most recent episode major depressive ([place])   Ideal dosage Depakote ER 1250.  Missing some of the 250mg .  Doesn't notice a lot if missing it.  Discussed the risk of dropping the dosage to 1000 mg daily may not result in immediate mood swings but they may occur gradually over a period of weeks or months.  Given that she had been stable and tolerating 1250 mg a day for an extended period of time would recommend that she not stray from that dosage.  Prior blood levels on the same dosage produced a valproic acid level of 76.  Therefore there is not a lot of room to drop and keep benefit.  She agrees  Due to extended period of stability she wishes to wait a year before follow-up.  She is aware of the risk of doing so and will call should she have mood swings  Hiram Comber, MD, DFAPA  Please see After Visit Summary for patient specific instructions.  No future appointments.  No orders of the defined types were placed in this encounter.     -------------------------------

## 2018-11-30 IMAGING — CR DG HIP (WITH OR WITHOUT PELVIS) 2-3V*L*
1 series · 3 of 3 positions shown · non-contrast
Comparison: None.

CLINICAL DATA: Fall 1 month ago with left groin pain. Initial
encounter.

EXAM:
DG HIP (WITH OR WITHOUT PELVIS) 2-3V LEFT

[Series 1: dg hip unilat w or w/o pelvis 2-3 views  · non-contrast · 0.14mm/px · 3 of 3 slices shown]
[im 1/3]
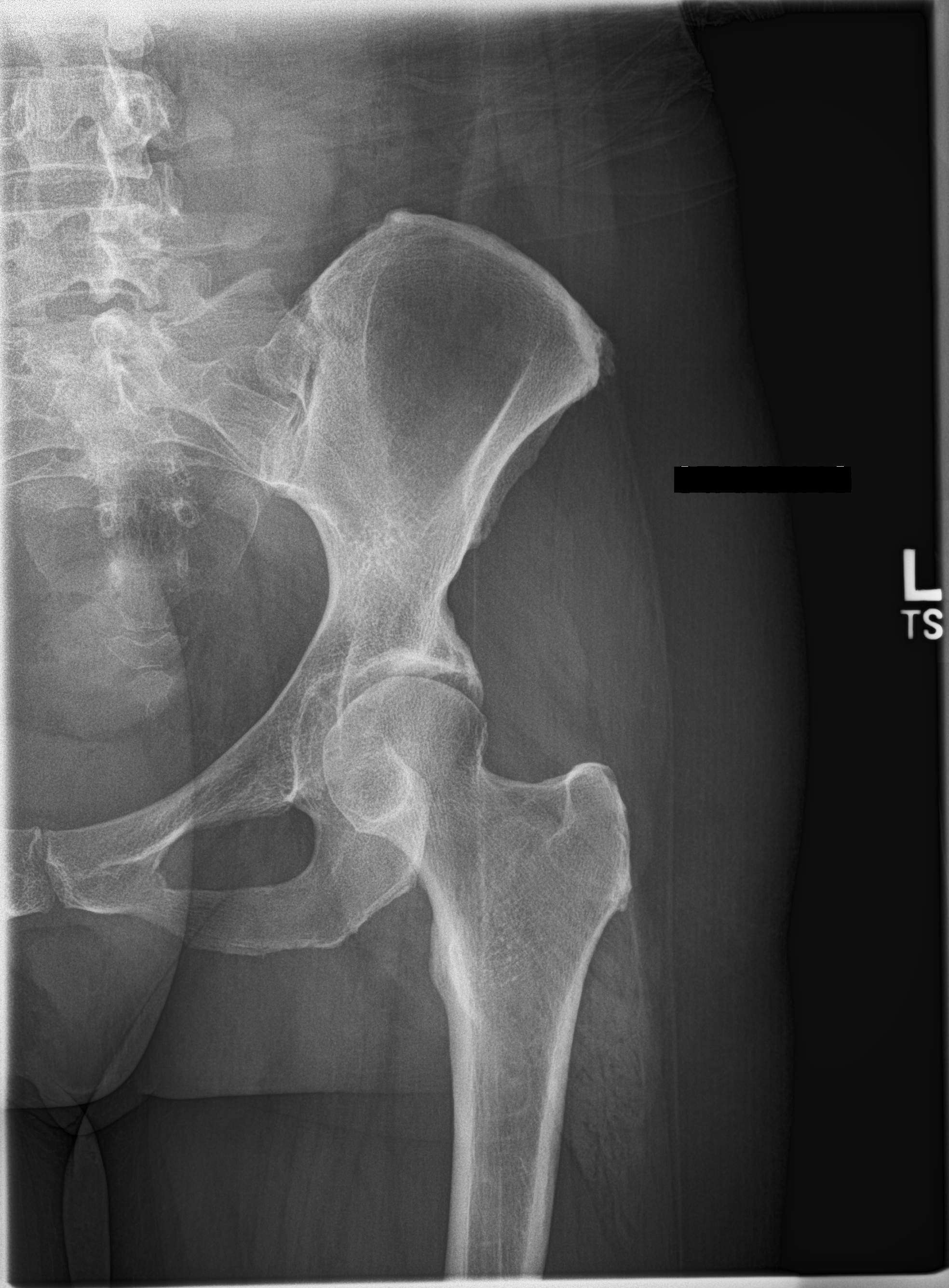
[im 2/3]
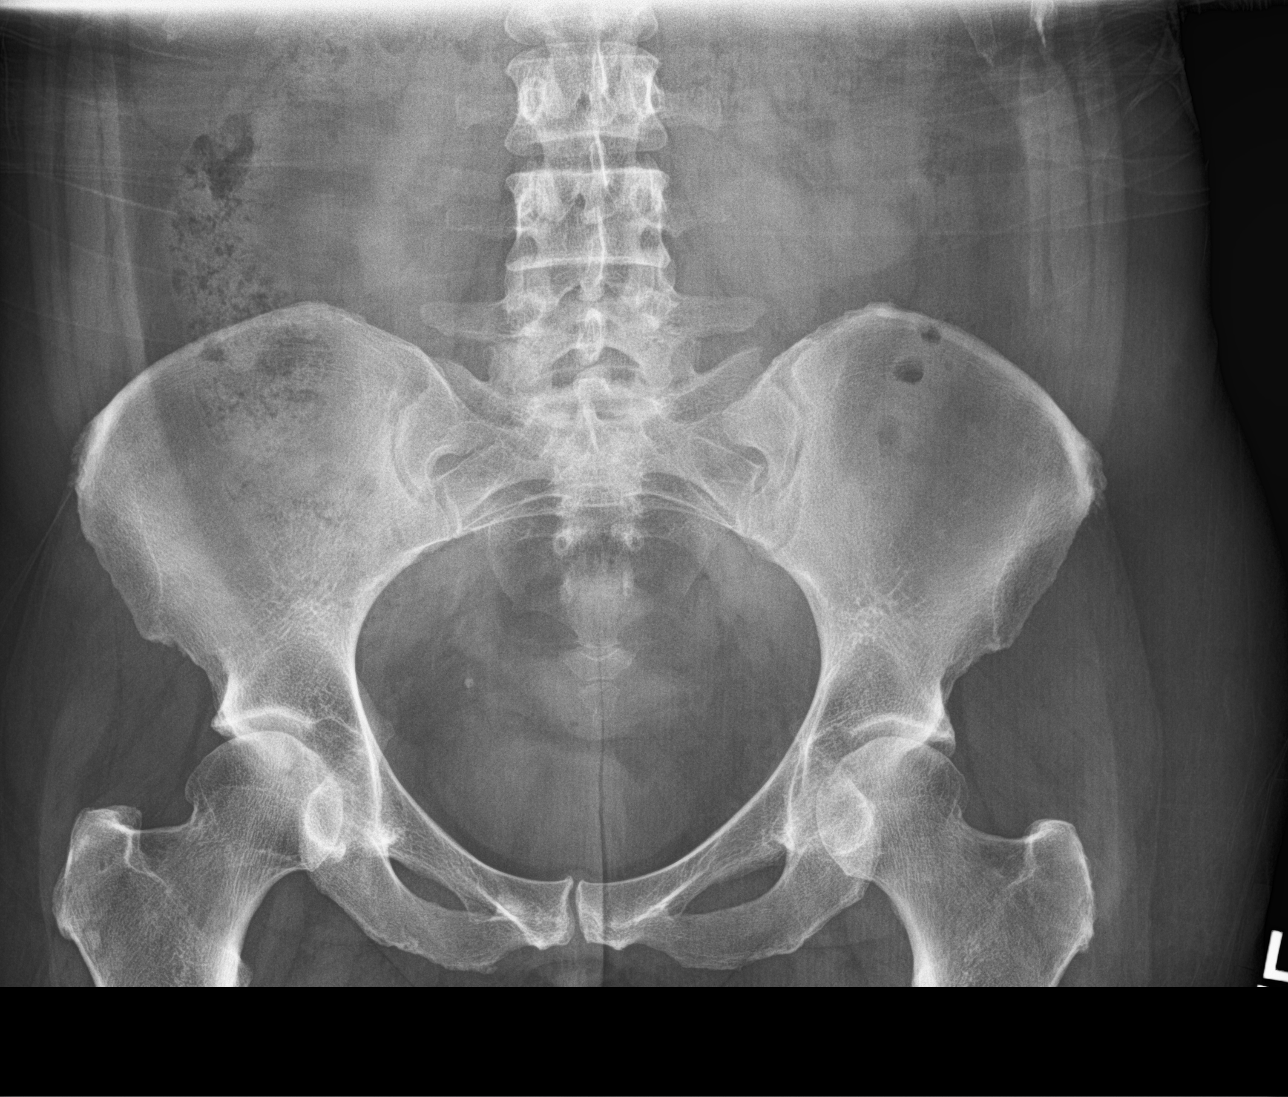
[im 3/3]
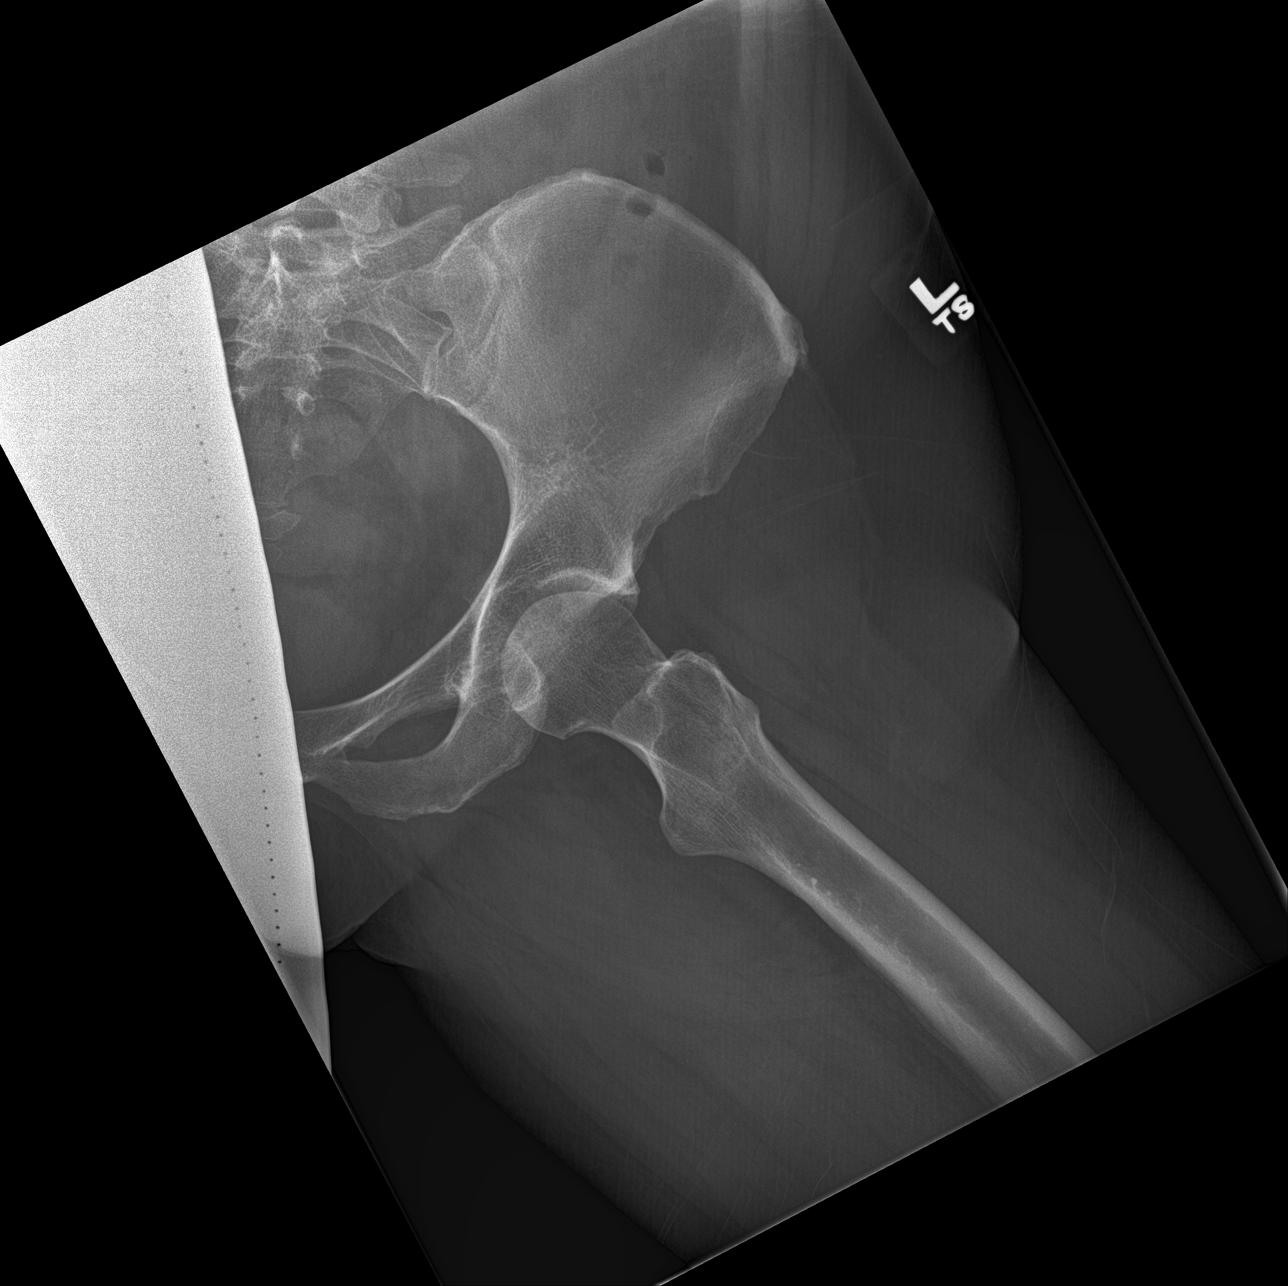

[3 of 3 positions shown; findings below may reference images not displayed]

FINDINGS: There is no evidence of hip fracture or dislocation. No degenerative
hip narrowing. No evidence of osteonecrosis. No acute soft tissue
finding.
IMPRESSION: Negative.

## 2018-12-03 ENCOUNTER — Other Ambulatory Visit: Payer: Self-pay

## 2018-12-03 MED ORDER — DIVALPROEX SODIUM 500 MG PO DR TAB: 1000.0000 mg | DELAYED_RELEASE_TABLET | Freq: Every day | ORAL | 3 refills | Status: DC

## 2018-12-15 ENCOUNTER — Emergency Department: Payer: BC Managed Care – PPO

## 2018-12-15 ENCOUNTER — Other Ambulatory Visit: Payer: Self-pay

## 2018-12-15 ENCOUNTER — Encounter: Payer: Self-pay | Admitting: Emergency Medicine

## 2018-12-15 ENCOUNTER — Emergency Department
Admission: EM | Admit: 2018-12-15 | Discharge: 2018-12-15 | Disposition: A | Discharge status: Home / Self Care | Payer: BC Managed Care – PPO | Attending: Emergency Medicine | Admitting: Emergency Medicine

## 2018-12-15 DIAGNOSIS — Z79899 Other long term (current) drug therapy: Secondary | ICD-10-CM | POA: Insufficient documentation

## 2018-12-15 DIAGNOSIS — J45909 Unspecified asthma, uncomplicated: Secondary | ICD-10-CM | POA: Diagnosis not present

## 2018-12-15 DIAGNOSIS — R6884 Jaw pain: Secondary | ICD-10-CM | POA: Insufficient documentation

## 2018-12-15 DIAGNOSIS — Z87891 Personal history of nicotine dependence: Secondary | ICD-10-CM | POA: Insufficient documentation

## 2018-12-15 DIAGNOSIS — K2970 Gastritis, unspecified, without bleeding: Secondary | ICD-10-CM | POA: Insufficient documentation

## 2018-12-15 DIAGNOSIS — M79602 Pain in left arm: Secondary | ICD-10-CM | POA: Insufficient documentation

## 2018-12-15 DIAGNOSIS — R079 Chest pain, unspecified: Secondary | ICD-10-CM | POA: Diagnosis present

## 2018-12-15 LAB — BASIC METABOLIC PANEL
Anion gap: 10 (ref 5–15)
BUN: 14 mg/dL (ref 6–20)
CO2: 27 mmol/L (ref 22–32)
Calcium: 9.6 mg/dL (ref 8.9–10.3)
Chloride: 99 mmol/L (ref 98–111)
Creatinine, Ser: 0.71 mg/dL (ref 0.44–1.00)
GFR calc Af Amer: >60 mL/min (ref >60)
GFR calc non Af Amer: >60 mL/min (ref >60)
Glucose, Bld: 119 mg/dL — ABNORMAL HIGH (ref 70–99)
Potassium: 3.9 mmol/L (ref 3.5–5.1)
Sodium: 136 mmol/L (ref 135–145)

## 2018-12-15 LAB — CBC
HCT: 38.2 % (ref 36.0–46.0)
Hemoglobin: 12.7 g/dL (ref 12.0–15.0)
MCH: 28.5 pg (ref 26.0–34.0)
MCHC: 33.2 g/dL (ref 30.0–36.0)
MCV: 85.7 fL (ref 80.0–100.0)
Platelets: 183 10*3/uL (ref 150–400)
RBC: 4.46 MIL/uL (ref 3.87–5.11)
RDW: 13.1 % (ref 11.5–15.5)
WBC: 9.5 10*3/uL (ref 4.0–10.5)
nRBC: 0 % (ref 0.0–0.2)

## 2018-12-15 LAB — TROPONIN I (HIGH SENSITIVITY)
Troponin I (High Sensitivity): <2 ng/L (ref <18)
Troponin I (High Sensitivity): <2 ng/L (ref <18)

## 2018-12-15 MED ORDER — LIDOCAINE VISCOUS HCL 2 % MT SOLN
15.0000 mL | Freq: Once | OROMUCOSAL | Status: AC
  Administered 2018-12-15: 15 mL via OROMUCOSAL
  Filled 2018-12-15: qty 15

## 2018-12-15 MED ORDER — SUCRALFATE 1 G PO TABS: 1.0000 g | ORAL_TABLET | Freq: Four times a day (QID) | ORAL | 0 refills | Status: DC

## 2018-12-15 MED ORDER — FAMOTIDINE 20 MG PO TABS: 20.0000 mg | ORAL_TABLET | Freq: Two times a day (BID) | ORAL | 1 refills | Status: DC

## 2018-12-15 MED ORDER — LORAZEPAM 0.5 MG PO TABS
0.5000 mg | ORAL_TABLET | Freq: Once | ORAL | Status: AC
  Administered 2018-12-15: 0.5 mg via ORAL
  Filled 2018-12-15: qty 1

## 2018-12-15 NOTE — ED Provider Notes (Signed)
[facility] Emergency Department Provider Note   ____________________________________________   I have reviewed the triage vital signs and the nursing notes.   HISTORY  Chief Complaint Chest Pain   History limited by: Not Limited   HPI Kayla Knox is a 53 y.o. female who presents to the emergency department today because of concern for chest pain. Started around 2am this morning. The patient had not yet gone to sleep. The pain is located in the center of her chest. Described as tightness. She then started developing pain in her jaw and left arm. Denies any shortness of breath or diaphoresis. She does say that she was thinking of a lot of emotional charged things as she was trying to get to bed. She denies similar symptoms in the past. Denies any recent illness.    Records reviewed. Per medical record review patient has a history of asthma  Past Medical History:  Diagnosis Date  . Arthritis   . Asthma   . Bipolar 1 disorder ([place])   . Migraines   . Seasonal allergies     Patient Active Problem List   Diagnosis Date Noted  . Bipolar 1 disorder ([place]) 03/02/2016    Past Surgical History:  Procedure Laterality Date  . ABLATION    . KNEE SURGERY      Prior to Admission medications   Medication Sig Start Date End Date Taking? Authorizing Provider  Albuterol Sulfate (PROAIR RESPICLICK) 536 (90 Base) MCG/ACT AEPB Inhale into the lungs.    [provider]  divalproex (DEPAKOTE ER) 250 MG 24 hr tablet Take 1 tablet (250 mg total) by mouth at bedtime. 06/09/18   Cottle, Billey Co., MD  divalproex (DEPAKOTE) 500 MG DR tablet Take 2 tablets (1,000 mg total) by mouth at bedtime. 12/03/18   Cottle, Billey Co., MD  fluticasone (FLONASE) 50 MCG/ACT nasal spray Place 1 spray into both nostrils daily.    [provider]  fluticasone furoate-vilanterol (BREO ELLIPTA) 100-25 MCG/INH AEPB Inhale 1 puff into the lungs daily.    [provider]  Hypromellose Vertell Limber ALLERGY NASAL SPRAY NA) Place into the nose.    [provider]  lamoTRIgine (LAMICTAL) 100 MG tablet Take 1 tablet (100 mg total) by mouth daily. 06/09/18   Cottle, Billey Co., MD  loratadine (CLARITIN) 10 MG tablet Take 10 mg by mouth daily.    [provider]  montelukast (SINGULAIR) 10 MG tablet TAKE 1 TABLET (10 MG TOTAL) BY MOUTH AT BEDTIME. 06/29/16   Sable Feil, PA-C    Allergies Promethazine, Azithromycin, and Penicillin g  Family History  Problem Relation Age of Onset  . Colon cancer Maternal Uncle     Social History Social History   Tobacco Use  . Smoking status: Former Research scientist (life sciences)  . Smokeless tobacco: Never Used  Substance Use Topics  . Alcohol use: No    Alcohol/week: 0.0 standard drinks  . Drug use: No    Review of Systems Constitutional: No fever/chills Eyes: No visual changes. ENT: No sore throat. Cardiovascular: Positive for chest pain.  Respiratory: Denies shortness of breath. Gastrointestinal: No abdominal pain.  No nausea, no vomiting.  No diarrhea.   Genitourinary: Negative for dysuria. Musculoskeletal: Positive for left arm pain.  Skin: Negative for rash. Neurological: Negative for headaches, focal weakness or numbness.  ____________________________________________   PHYSICAL EXAM:  VITAL SIGNS: ED Triage Vitals [12/15/18 0309]  Enc Vitals Group     BP (!) 147/67  Pulse Rate 84     Resp 18     Temp 98.4 F (36.9 C)     Temp Source Oral     SpO2 97 %     Weight 147 lb (66.7 kg)     Height 5' 1.5" (1.562 m)     Head Circumference      Peak Flow      Pain Score 4   Constitutional: Alert and oriented.  Eyes: Conjunctivae are normal.  ENT      Head: Normocephalic and atraumatic.      Nose: No congestion/rhinnorhea.      Mouth/Throat: Mucous membranes are moist.      Neck: No stridor. Hematological/Lymphatic/Immunilogical: No cervical lymphadenopathy. Cardiovascular: Normal  rate, regular rhythm.  No murmurs, rubs, or gallops.  Respiratory: Normal respiratory effort without tachypnea nor retractions. Breath sounds are clear and equal bilaterally. No wheezes/rales/rhonchi. Gastrointestinal: Soft and non tender. No rebound. No guarding.  Genitourinary: Deferred Musculoskeletal: Normal range of motion in all extremities. No lower extremity edema. Neurologic:  Normal speech and language. No gross focal neurologic deficits are appreciated.  Skin:  Skin is warm, dry and intact. No rash noted. Psychiatric: Mood and affect are normal. Speech and behavior are normal. Patient exhibits appropriate insight and judgment.  ____________________________________________    LABS (pertinent positives/negatives)  Trop <2 CBC wbc 9.5, hgb 12.7, plt 183 BMP wnl except glu 119  ____________________________________________   EKG  I, Nance Pear, attending physician, personally viewed and interpreted this EKG  EKG Time: 0259 Rate: 79 Rhythm: normal sinus rhythm Axis: normal Intervals: qtc 426 QRS: low voltage ST changes: no st elevation Impression: abnormal ekg  ____________________________________________    RADIOLOGY  CXR No acute pulmonary process  ____________________________________________   PROCEDURES  Procedures  ____________________________________________   INITIAL IMPRESSION / ASSESSMENT AND PLAN / ED COURSE  Pertinent labs & imaging results that were available during my care of the patient were reviewed by me and considered in my medical decision making (see chart for details).   Patient presented to the emergency department today because of concerns for chest pain.  Differential would be broad including pneumonia, pneumothorax, esophagitis, costochondritis, aortic dissection, PE amongst other etiologies.  Patient's chest x-ray negative for acute findings.  Troponin negative x2.  Patient did feel better after viscous lidocaine.  At this  point I do wonder if patient suffering from gastritis esophagitis.  Discussed this with the patient.  Will prescribe antiacid and sucralfate.  ____________________________________________   FINAL CLINICAL IMPRESSION(S) / ED DIAGNOSES  Final diagnoses:  Chest pain, unspecified type  Gastritis without bleeding, unspecified chronicity, unspecified gastritis type     Note: This dictation was prepared with Dragon dictation. Any transcriptional errors that result from this process are unintentional     Nance Pear, MD 12/15/18 [phone]

## 2018-12-15 NOTE — ED Notes (Signed)
Pt given warm blanket for comfort

## 2018-12-15 NOTE — ED Triage Notes (Signed)
Reports having chest pain, left arm pain an jaw pain that started around 2 am.  Denies nausea or short of breath.

## 2018-12-15 NOTE — Discharge Instructions (Addendum)
Please seek medical attention for any high fevers, chest pain, shortness of breath, change in behavior, persistent vomiting, bloody stool or any other new or concerning symptoms.  

## 2019-01-28 ENCOUNTER — Telehealth: Payer: Self-pay | Admitting: Psychiatry

## 2019-01-28 ENCOUNTER — Other Ambulatory Visit: Payer: Self-pay

## 2019-01-28 MED ORDER — LAMOTRIGINE 100 MG PO TABS: 100.0000 mg | ORAL_TABLET | Freq: Every day | ORAL | 3 refills | Status: DC

## 2019-01-28 NOTE — Telephone Encounter (Signed)
Refill submitted, patient comes in yearly

## 2019-01-28 NOTE — Telephone Encounter (Signed)
Patient called and said that she is out of her lamictal and she needs a refill sent to [facility] in [place] [address]

## 2019-06-02 ENCOUNTER — Other Ambulatory Visit: Payer: Self-pay | Admitting: Psychiatry

## 2019-06-10 ENCOUNTER — Encounter: Payer: Self-pay | Admitting: Psychiatry

## 2019-06-10 ENCOUNTER — Ambulatory Visit (INDEPENDENT_AMBULATORY_CARE_PROVIDER_SITE_OTHER): Payer: BC Managed Care – PPO | Admitting: Psychiatry

## 2019-06-10 DIAGNOSIS — F3181 Bipolar II disorder: Secondary | ICD-10-CM | POA: Diagnosis not present

## 2019-06-10 DIAGNOSIS — F3281 Premenstrual dysphoric disorder: Secondary | ICD-10-CM

## 2019-06-10 MED ORDER — DIVALPROEX SODIUM 500 MG PO DR TAB: 1000.0000 mg | DELAYED_RELEASE_TABLET | Freq: Every day | ORAL | 3 refills | Status: DC

## 2019-06-10 MED ORDER — ESCITALOPRAM OXALATE 5 MG PO TABS: 5.0000 mg | ORAL_TABLET | Freq: Every day | ORAL | 0 refills | Status: DC

## 2019-06-10 MED ORDER — LAMOTRIGINE 100 MG PO TABS: 100.0000 mg | ORAL_TABLET | Freq: Every day | ORAL | 3 refills | Status: DC

## 2019-06-10 MED ORDER — DIVALPROEX SODIUM ER 250 MG PO TB24: 250.0000 mg | ORAL_TABLET | Freq: Every day | ORAL | 3 refills | Status: DC

## 2019-06-10 NOTE — Progress Notes (Signed)
Kayla Knox [id number] 03/04/1966 53 y.o.  Subjective:   Patient ID:  Kayla Knox is a 53 y.o. (DOB April 27, 1966) female.  Chief Complaint:  Chief Complaint  Patient presents with  . Follow-up    Medication Management  . Other    Bipolar 2     HPI [employer] presents to the office today for follow-up of bipolar disorder.  Last seen December 2019.  She had been somewhat inconsistent with Depakote taking between thousand and 1250 mg nightly.  1250 mg daily is the target dose.  She was encouraged to be more consistent. Still on lamotrigine 100  And rare sertraline 25 PRN PMDD.  Doing well except PMDD irritability and it's helpful.  Only takes a few.  Patient reports stable mood and denies depressed or irritable moods.  Patient denies any recent difficulty with anxiety.  Patient denies difficulty with sleep initiation or maintenance. Denies appetite disturbance.  Patient reports that energy and motivation have been good.  Patient denies any difficulty with concentration.  Patient denies any suicidal ideation. A little more moody with PMDD which is still a problem.  and perimenopausal with hot flashes.  2 biggest triggers stress and not enough sleep.  Past Psychiatric Medication Trials: Depakote, lamotrigine 100, lorazepam, Lexapro 10, sertraline 25, lithium thirsty and weight gain, Abilify 15  Review of Systems:  Review of Systems  Respiratory: Negative for cough.   Neurological: Negative for tremors and weakness.  Psychiatric/Behavioral: Negative for agitation, behavioral problems, confusion, decreased concentration, dysphoric mood, hallucinations, self-injury, sleep disturbance and suicidal ideas. The patient is not nervous/anxious and is not hyperactive.     Medications: I have reviewed the patient's current medications.  Current Outpatient Medications  Medication Sig Dispense Refill  . Albuterol Sulfate (PROAIR RESPICLICK) [id number] (90 Base) MCG/ACT AEPB Inhale into the  lungs.    . divalproex (DEPAKOTE ER) 250 MG 24 hr tablet TAKE 1 TABLET (250 MG TOTAL) BY MOUTH AT BEDTIME. 90 tablet 0  . divalproex (DEPAKOTE) 500 MG DR tablet Take 2 tablets (1,000 mg total) by mouth at bedtime. 180 tablet 3  . famotidine (PEPCID) 20 MG tablet Take 1 tablet (20 mg total) by mouth 2 (two) times daily. 60 tablet 1  . fluticasone (FLONASE) 50 MCG/ACT nasal spray Place 1 spray into both nostrils daily.    . fluticasone furoate-vilanterol (BREO ELLIPTA) 100-25 MCG/INH AEPB Inhale 1 puff into the lungs daily.    . Hypromellose (ALZAIR ALLERGY NASAL SPRAY NA) Place into the nose.    . lamoTRIgine (LAMICTAL) 100 MG tablet Take 1 tablet (100 mg total) by mouth daily. 90 tablet 3  . loratadine (CLARITIN) 10 MG tablet Take 10 mg by mouth daily.    . montelukast (SINGULAIR) 10 MG tablet TAKE 1 TABLET (10 MG TOTAL) BY MOUTH AT BEDTIME. 30 tablet 3   No current facility-administered medications for this visit.    Medication Side Effects: None , nausea and diarrhea with Sertraline Allergies:  Allergies  Allergen Reactions  . Promethazine Other (See Comments)    Seizure, hospitalized because of this medication   . Azithromycin Rash  . Penicillin G Rash    Past Medical History:  Diagnosis Date  . Arthritis   . Asthma   . Bipolar 1 disorder ([place])   . Migraines   . Seasonal allergies     Family History  Problem Relation Age of Onset  . Colon cancer Maternal Uncle     Social History   Socioeconomic History  .  Marital status: Married    Spouse name: Not on file  . Number of children: Not on file  . Years of education: Not on file  . Highest education level: Not on file  Occupational History  . Not on file  Tobacco Use  . Smoking status: Former Research scientist (life sciences)  . Smokeless tobacco: Never Used  Substance and Sexual Activity  . Alcohol use: No    Alcohol/week: 0.0 standard drinks  . Drug use: No  . Sexual activity: Not on file  Other Topics Concern  . Not on file  Social  History Narrative  . Not on file   Social Determinants of Health   Financial Resource Strain:   . Difficulty of Paying Living Expenses: Not on file  Food Insecurity:   . Worried About Charity fundraiser in the Last Year: Not on file  . Ran Out of Food in the Last Year: Not on file  Transportation Needs:   . Lack of Transportation (Medical): Not on file  . Lack of Transportation (Non-Medical): Not on file  Physical Activity:   . Days of Exercise per Week: Not on file  . Minutes of Exercise per Session: Not on file  Stress:   . Feeling of Stress : Not on file  Social Connections:   . Frequency of Communication with Friends and Family: Not on file  . Frequency of Social Gatherings with Friends and Family: Not on file  . Attends Religious Services: Not on file  . Active Member of Clubs or Organizations: Not on file  . Attends Archivist Meetings: Not on file  . Marital Status: Not on file  Intimate Partner Violence:   . Fear of Current or Ex-Partner: Not on file  . Emotionally Abused: Not on file  . Physically Abused: Not on file  . Sexually Abused: Not on file    Past Medical History, Surgical history, Social history, and Family history were reviewed and updated as appropriate.   Please see review of systems for further details on the patient's review from today.   Objective:   Physical Exam:  There were no vitals taken for this visit.  Physical Exam Constitutional:      General: She is not in acute distress.    Appearance: She is well-developed.  Musculoskeletal:        General: No deformity.  Neurological:     Mental Status: She is alert and oriented to person, place, and time.     Motor: No tremor.     Coordination: Coordination normal.     Gait: Gait normal.  Psychiatric:        Attention and Perception: Attention and perception normal.        Mood and Affect: Mood is not anxious or depressed. Affect is not labile, blunt, angry or inappropriate.         Speech: Speech normal.        Behavior: Behavior normal.        Thought Content: Thought content normal. Thought content does not include homicidal or suicidal ideation. Thought content does not include homicidal or suicidal plan.        Cognition and Memory: Cognition normal.        Judgment: Judgment normal.     Comments: Insight intact. No auditory or visual hallucinations. No delusions.      Lab Review:     Component Value Date/Time   NA 136 12/15/2018 0316   K 3.9 12/15/2018 0316   CL  99 12/15/2018 0316   CO2 27 12/15/2018 0316   GLUCOSE 119 (H) 12/15/2018 0316   BUN 14 12/15/2018 0316   CREATININE 0.71 12/15/2018 0316   CALCIUM 9.6 12/15/2018 0316   GFRNONAA >60 12/15/2018 0316   GFRAA >60 12/15/2018 0316       Component Value Date/Time   WBC 9.5 12/15/2018 0316   RBC 4.46 12/15/2018 0316   HGB 12.7 12/15/2018 0316   HCT 38.2 12/15/2018 0316   PLT 183 12/15/2018 0316   MCV 85.7 12/15/2018 0316   MCH 28.5 12/15/2018 0316   MCHC 33.2 12/15/2018 0316   RDW 13.1 12/15/2018 0316    No results found for: POCLITH, LITHIUM   No results found for: PHENYTOIN, PHENOBARB, VALPROATE, CBMZ   .res Assessment: Plan:    Bipolar II disorder, most recent episode major depressive ([place])  PMDD (premenstrual dysphoric disorder)   Ideal dosage Depakote ER 1250.  Missing some of the 250mg .  Doesn't notice a lot if missing it.  Discussed the risk of dropping the dosage to 1000 mg daily may not result in immediate mood swings but they may occur gradually over a period of weeks or months.  Given that she had been stable and tolerating 1250 mg a day for an extended period of time would recommend that she not stray from that dosage.  Prior blood levels on the same dosage produced a valproic acid level of 76.  Therefore there is not a lot of room to drop and keep benefit.  She agrees  Due to extended period of stability she wishes to wait a year before follow-up.  She is aware of the risk  of doing so and will call should she have mood swings  Change from sertraline to Lexapro 5 for PMDD DT GI SE. Other option is fluoxetine  FU 1 year.  Hiram Comber, MD, DFAPA  Please see After Visit Summary for patient specific instructions.  No future appointments.  No orders of the defined types were placed in this encounter.     -------------------------------

## 2019-07-10 ENCOUNTER — Other Ambulatory Visit: Payer: Self-pay | Admitting: Psychiatry

## 2019-07-10 DIAGNOSIS — F3281 Premenstrual dysphoric disorder: Secondary | ICD-10-CM

## 2019-08-09 ENCOUNTER — Ambulatory Visit: Payer: BC Managed Care – PPO | Attending: Internal Medicine

## 2019-08-09 DIAGNOSIS — Z23 Encounter for immunization: Secondary | ICD-10-CM | POA: Insufficient documentation

## 2019-08-09 NOTE — Progress Notes (Signed)
   Covid-19 Vaccination Clinic  Name:  Kayla Knox    MRN: [id number] DOB: 25-Dec-1965  08/09/2019  [name] was observed post Covid-19 immunization for 30 minutes based on pre-vaccination screening without incidence. She was provided with Vaccine Information Sheet and instruction to access the V-Safe system.   Ms. [name] was instructed to call 911 with any severe reactions post vaccine: [name] Kitchen Difficulty breathing  . Swelling of your face and throat  . A fast heartbeat  . A bad rash all over your body  . Dizziness and weakness    Immunizations Administered    Name Date Dose VIS Date Route   Pfizer COVID-19 Vaccine 08/09/2019 12:33 PM 0.3 mL 05/22/2019 Intramuscular   Manufacturer: [place]   Lot: [id number]   [place]: [id number]

## 2019-09-02 ENCOUNTER — Ambulatory Visit: Payer: BC Managed Care – PPO | Attending: Internal Medicine

## 2019-09-02 DIAGNOSIS — Z23 Encounter for immunization: Secondary | ICD-10-CM

## 2019-09-02 NOTE — Progress Notes (Signed)
   Covid-19 Vaccination Clinic  Name:  Kayla Knox    MRN: [id number] DOB: 10-Jul-1965  09/02/2019  [name] was observed post Covid-19 immunization for 30 minutes based on pre-vaccination screening without incident. She was provided with Vaccine Information Sheet and instruction to access the V-Safe system.   [name] was instructed to call 911 with any severe reactions post vaccine: [name] Kitchen Difficulty breathing  . Swelling of face and throat  . A fast heartbeat  . A bad rash all over body  . Dizziness and weakness   Immunizations Administered    Name Date Dose VIS Date Route   Pfizer COVID-19 Vaccine 09/02/2019  4:29 PM 0.3 mL 05/22/2019 Intramuscular   Manufacturer: [place]   Lot: [id number]   [place]: [id number]

## 2019-11-18 ENCOUNTER — Encounter: Payer: Self-pay | Admitting: Pulmonary Disease

## 2019-11-18 ENCOUNTER — Ambulatory Visit: Payer: BC Managed Care – PPO | Admitting: Pulmonary Disease

## 2019-11-18 ENCOUNTER — Other Ambulatory Visit: Payer: Self-pay

## 2019-11-18 VITALS — BP 110/70 | HR 83 | Temp 98.4°F | Ht 61.5 in | Wt 151.0 lb

## 2019-11-18 DIAGNOSIS — R0602 Shortness of breath: Secondary | ICD-10-CM

## 2019-11-18 MED ORDER — BREO ELLIPTA 200-25 MCG/INH IN AEPB: 1.0000 | INHALATION_SPRAY | Freq: Every day | RESPIRATORY_TRACT | 0 refills | Status: AC

## 2019-11-18 NOTE — Patient Instructions (Signed)
We will try Breo 200  Complete your course of antibiotics  We will repeat your breathing study  We will follow-up with you in 4 to 6 weeks  Call with significant concerns

## 2019-11-18 NOTE — Progress Notes (Signed)
Kayla Knox    [id number]    05-Dec-1965  Primary Care Physician:Lindley, Jordan Likes, FNP  Referring Physician: Remi Haggard, [address] [place],  [place] [address]  Chief complaint:   Patient is being seen for shortness of breath  HPI:  Worsening symptoms in the last couple years She has been on Breo 100 with some improvement in symptoms Currently being treated for an exacerbation  She does have rhinitis was not found to be specifically allergic to anything Follows up with allergist  Did smoke in the remote past over 26 years ago less than a pack a day She has a history of asthma  She does have a cough, shortness of breath with activity She has some chest discomfort relating to protracted cough recently  Multiple situations make the symptoms worse Breo 100 has helped a bit  Outpatient Encounter Medications as of 11/18/2019  Medication Sig  . Albuterol Sulfate (PROAIR RESPICLICK) 553 (90 Base) MCG/ACT AEPB Inhale into the lungs.  . clarithromycin (BIAXIN) 500 MG tablet Take 500 mg by mouth 2 (two) times daily.  . divalproex (DEPAKOTE ER) 250 MG 24 hr tablet Take 1 tablet (250 mg total) by mouth at bedtime.  . divalproex (DEPAKOTE) 500 MG DR tablet Take 2 tablets (1,000 mg total) by mouth at bedtime.  . famotidine (PEPCID) 20 MG tablet Take 1 tablet (20 mg total) by mouth 2 (two) times daily.  . fluticasone (FLONASE) 50 MCG/ACT nasal spray Place 1 spray into both nostrils daily.  . fluticasone furoate-vilanterol (BREO ELLIPTA) 100-25 MCG/INH AEPB Inhale 1 puff into the lungs daily.  . Hypromellose (ALZAIR ALLERGY NASAL SPRAY NA) Place into the nose.  . lamoTRIgine (LAMICTAL) 100 MG tablet Take 1 tablet (100 mg total) by mouth daily.  [name] Kitchen loratadine (CLARITIN) 10 MG tablet Take 10 mg by mouth daily.  . montelukast (SINGULAIR) 10 MG tablet TAKE 1 TABLET (10 MG TOTAL) BY MOUTH AT BEDTIME.  . [DISCONTINUED] escitalopram (LEXAPRO) 5 MG tablet TAKE 1 TABLET  BY MOUTH EVERY DAY   No facility-administered encounter medications on file as of 11/18/2019.    Allergies as of 11/18/2019 - Review Complete 11/18/2019  Allergen Reaction Noted  . Promethazine Other (See Comments) 05/04/2015  . Azithromycin Rash 05/04/2015  . Penicillin g Rash 05/04/2015    Past Medical History:  Diagnosis Date  . Arthritis   . Asthma   . Bipolar 1 disorder ([place])   . Migraines   . Seasonal allergies     Past Surgical History:  Procedure Laterality Date  . ABLATION    . KNEE SURGERY      Family History  Problem Relation Age of Onset  . Colon cancer Maternal Uncle     Social History   Socioeconomic History  . Marital status: Married    Spouse name: Not on file  . Number of children: Not on file  . Years of education: Not on file  . Highest education level: Not on file  Occupational History  . Not on file  Tobacco Use  . Smoking status: Former Research scientist (life sciences)  . Smokeless tobacco: Never Used  Substance and Sexual Activity  . Alcohol use: No    Alcohol/week: 0.0 standard drinks  . Drug use: No  . Sexual activity: Not on file  Other Topics Concern  . Not on file  Social History Narrative  . Not on file   Social Determinants of Health   Financial Resource Strain:   .  Difficulty of Paying Living Expenses:   Food Insecurity:   . Worried About Charity fundraiser in the Last Year:   . Arboriculturist in the Last Year:   Transportation Needs:   . Film/video editor (Medical):   [name] Kitchen Lack of Transportation (Non-Medical):   Physical Activity:   . Days of Exercise per Week:   . Minutes of Exercise per Session:   Stress:   . Feeling of Stress :   Social Connections:   . Frequency of Communication with Friends and Family:   . Frequency of Social Gatherings with Friends and Family:   . Attends Religious Services:   . Active Member of Clubs or Organizations:   . Attends Archivist Meetings:   [name] Kitchen Marital Status:   Intimate Partner Violence:    . Fear of Current or Ex-Partner:   . Emotionally Abused:   [name] Kitchen Physically Abused:   . Sexually Abused:     Review of Systems  Constitutional: Negative.   HENT: Negative.   Respiratory: Positive for cough and shortness of breath.   Cardiovascular: Negative.   Gastrointestinal: Negative.   Musculoskeletal: Negative.     Vitals:   11/18/19 1505  BP: 110/70  Pulse: 83  Temp: 98.4 F (36.9 C)  SpO2: 98%    Physical Exam  Constitutional: She appears well-developed and well-nourished.  HENT:  Head: Normocephalic and atraumatic.  Eyes: Right eye exhibits no discharge. Left eye exhibits no discharge.  Neck: No tracheal deviation present. No thyromegaly present.  Cardiovascular: Normal rate and regular rhythm.  Pulmonary/Chest: Effort normal and breath sounds normal. No respiratory distress. She has no wheezes. She has no rales. She exhibits no tenderness.  Neurological: She is alert.  Psychiatric: She has a normal mood and affect.   Data Reviewed: Chest x-ray from 2020 reviewed by myself-no acute infiltrate Assessment:  Shortness of breath  Possible obstructive lung disease  Past history of asthma  Currently on Breo 100-mild improvement in symptoms  Currently being treated for an exacerbation with Biaxin  Plan/Recommendations: Increase Breo to 200  Obtain a pulmonary function test  Continue other lines of care  Follow-up in 4 to 6 weeks  Sherrilyn Rist MD [facility] 11/18/2019, 3:40 PM  CC: Remi Haggard, FNP

## 2019-11-18 NOTE — Addendum Note (Signed)
Addended by: Valerie Salts on: 11/18/2019 04:38 PM   Modules accepted: Orders

## 2019-12-24 ENCOUNTER — Ambulatory Visit: Payer: BC Managed Care – PPO | Admitting: Pulmonary Disease

## 2019-12-24 DIAGNOSIS — R0602 Shortness of breath: Secondary | ICD-10-CM | POA: Diagnosis not present

## 2019-12-24 LAB — PULMONARY FUNCTION TEST
DL/VA % pred: 134 %
DL/VA: 5.91 ml/min/mmHg/L
DLCO cor % pred: 122 %
DLCO cor: 23.47 ml/min/mmHg
DLCO unc % pred: 122 %
DLCO unc: 23.47 ml/min/mmHg
FEF 25-75 Post: 2.17 L/sec
FEF 25-75 Pre: 2.32 L/sec
FEF2575-%Change-Post: -6 %
FEF2575-%Pred-Post: 84 %
FEF2575-%Pred-Pre: 89 %
FEV1-%Change-Post: -1 %
FEV1-%Pred-Post: 78 %
FEV1-%Pred-Pre: 79 %
FEV1-Post: 2 L
FEV1-Pre: 2.03 L
FEV1FVC-%Change-Post: 0 %
FEV1FVC-%Pred-Pre: 104 %
FEV6-%Change-Post: -2 %
FEV6-%Pred-Post: 75 %
FEV6-%Pred-Pre: 77 %
FEV6-Post: 2.36 L
FEV6-Pre: 2.42 L
FEV6FVC-%Pred-Post: 102 %
FEV6FVC-%Pred-Pre: 102 %
FVC-%Change-Post: -2 %
FVC-%Pred-Post: 73 %
FVC-%Pred-Pre: 75 %
FVC-Post: 2.36 L
FVC-Pre: 2.42 L
Post FEV1/FVC ratio: 84 %
Post FEV6/FVC ratio: 100 %
Pre FEV1/FVC ratio: 84 %
Pre FEV6/FVC Ratio: 100 %
RV % pred: 116 %
RV: 1.96 L
TLC % pred: 92 %
TLC: 4.34 L

## 2019-12-24 NOTE — Progress Notes (Signed)
Full PFT performed today. °

## 2020-01-04 ENCOUNTER — Other Ambulatory Visit: Payer: Self-pay | Admitting: Psychiatry

## 2020-01-04 DIAGNOSIS — F3281 Premenstrual dysphoric disorder: Secondary | ICD-10-CM

## 2020-01-05 ENCOUNTER — Ambulatory Visit: Payer: BC Managed Care – PPO | Admitting: Pulmonary Disease

## 2020-01-05 ENCOUNTER — Other Ambulatory Visit: Payer: Self-pay

## 2020-01-05 ENCOUNTER — Encounter: Payer: Self-pay | Admitting: Pulmonary Disease

## 2020-01-05 VITALS — BP 122/70 | HR 70 | Temp 98.5°F | Ht 61.0 in | Wt 148.0 lb

## 2020-01-05 DIAGNOSIS — R0602 Shortness of breath: Secondary | ICD-10-CM | POA: Diagnosis not present

## 2020-01-05 NOTE — Patient Instructions (Signed)
Stable status Breathing staying well with Breo  Continue with Breo Rescue inhaler use as needed  I will see you in about 6 months  Call with any concerns

## 2020-01-05 NOTE — Progress Notes (Signed)
Kayla Knox    [id number]    03/27/1966  Primary Care Physician:Lindley, Jordan Likes, FNP  Referring Physician: Remi Haggard, [place],  [place] [address]  Chief complaint:   Patient is being seen for shortness of breath  HPI:  Worsening symptoms in the last couple years She has been on Breo 200 with some improvement in symptoms Currently being treated for an exacerbation She feels a lot better with the Breo  No use of rescue inhalers at present  She does have rhinitis was not found to be specifically allergic to anything Follows up with allergist  Did smoke in the remote past over 26 years ago less than a pack a day She has a history of asthma  She does have a cough, shortness of breath with activity She has some chest discomfort relating to protracted cough recently  Multiple situations make the symptoms worse Breo 100 has helped a bit  Outpatient Encounter Medications as of 01/05/2020  Medication Sig  . Albuterol Sulfate (PROAIR RESPICLICK) 643 (90 Base) MCG/ACT AEPB Inhale into the lungs.  . divalproex (DEPAKOTE ER) 250 MG 24 hr tablet Take 1 tablet (250 mg total) by mouth at bedtime.  . divalproex (DEPAKOTE) 500 MG DR tablet Take 2 tablets (1,000 mg total) by mouth at bedtime.  . fluticasone (FLONASE) 50 MCG/ACT nasal spray Place 1 spray into both nostrils daily.  . fluticasone furoate-vilanterol (BREO ELLIPTA) 100-25 MCG/INH AEPB Inhale 1 puff into the lungs daily.  . fluticasone furoate-vilanterol (BREO ELLIPTA) 200-25 MCG/INH AEPB Inhale 1 puff into the lungs daily.  . Hypromellose (ALZAIR ALLERGY NASAL SPRAY NA) Place into the nose.  . lamoTRIgine (LAMICTAL) 100 MG tablet Take 1 tablet (100 mg total) by mouth daily.  [name] Kitchen loratadine (CLARITIN) 10 MG tablet Take 10 mg by mouth daily.  . montelukast (SINGULAIR) 10 MG tablet TAKE 1 TABLET (10 MG TOTAL) BY MOUTH AT BEDTIME.  . clarithromycin (BIAXIN) 500 MG tablet Take 500 mg by mouth  2 (two) times daily. (Patient not taking: Reported on 01/05/2020)  . famotidine (PEPCID) 20 MG tablet Take 1 tablet (20 mg total) by mouth 2 (two) times daily.   No facility-administered encounter medications on file as of 01/05/2020.    Allergies as of 01/05/2020 - Review Complete 11/18/2019  Allergen Reaction Noted  . Promethazine Other (See Comments) 05/04/2015  . Azithromycin Rash 05/04/2015  . Penicillin g Rash 05/04/2015    Past Medical History:  Diagnosis Date  . Arthritis   . Asthma   . Bipolar 1 disorder ([place])   . Migraines   . Seasonal allergies     Past Surgical History:  Procedure Laterality Date  . ABLATION    . KNEE SURGERY      Family History  Problem Relation Age of Onset  . Colon cancer Maternal Uncle     Social History   Socioeconomic History  . Marital status: Married    Spouse name: Not on file  . Number of children: Not on file  . Years of education: Not on file  . Highest education level: Not on file  Occupational History  . Not on file  Tobacco Use  . Smoking status: Former Research scientist (life sciences)  . Smokeless tobacco: Never Used  Substance and Sexual Activity  . Alcohol use: No    Alcohol/week: 0.0 standard drinks  . Drug use: No  . Sexual activity: Not on file  Other Topics Concern  .  Not on file  Social History Narrative  . Not on file   Social Determinants of Health   Financial Resource Strain:   . Difficulty of Paying Living Expenses:   Food Insecurity:   . Worried About Charity fundraiser in the Last Year:   . Arboriculturist in the Last Year:   Transportation Needs:   . Film/video editor (Medical):   [name] Kitchen Lack of Transportation (Non-Medical):   Physical Activity:   . Days of Exercise per Week:   . Minutes of Exercise per Session:   Stress:   . Feeling of Stress :   Social Connections:   . Frequency of Communication with Friends and Family:   . Frequency of Social Gatherings with Friends and Family:   . Attends Religious Services:    . Active Member of Clubs or Organizations:   . Attends Archivist Meetings:   [name] Kitchen Marital Status:   Intimate Partner Violence:   . Fear of Current or Ex-Partner:   . Emotionally Abused:   [name] Kitchen Physically Abused:   . Sexually Abused:     Review of Systems  Constitutional: Negative.   HENT: Negative.   Respiratory: Positive for shortness of breath. Negative for cough.   Cardiovascular: Negative.   Gastrointestinal: Negative.   Musculoskeletal: Negative.     Vitals:   01/05/20 0930  BP: 122/70  Pulse: 70  Temp: 98.5 F (36.9 C)  SpO2: 96%    Physical Exam Constitutional:      Appearance: She is well-developed.  HENT:     Head: Normocephalic and atraumatic.     Mouth/Throat:     Mouth: Mucous membranes are moist.  Eyes:     General:        Right eye: No discharge.        Left eye: No discharge.  Neck:     Thyroid: No thyromegaly.     Trachea: No tracheal deviation.  Cardiovascular:     Rate and Rhythm: Normal rate and regular rhythm.  Pulmonary:     Effort: Pulmonary effort is normal. No respiratory distress.     Breath sounds: Normal breath sounds. No wheezing or rales.  Chest:     Chest wall: No tenderness.  Neurological:     Mental Status: She is alert.    Data Reviewed: Chest x-ray from 2020 reviewed by myself-no acute infiltrate  PFT is within normal limits with no significant bronchodilator response  Assessment:  Shortness of breath  Possible obstructive lung disease -No evidence of significant obstruction on PFT  Past history of asthma -Significant benefit from using Breo  Plan/Recommendations: Continue Breo 200  Rescue inhaler use as needed  Continue other lines of care  Follow-up in 6 months  Sherrilyn Rist MD [facility] 01/05/2020, 9:53 AM  CC: Remi Haggard, FNP

## 2020-01-06 NOTE — Telephone Encounter (Signed)
Should she be taking?

## 2020-02-01 ENCOUNTER — Other Ambulatory Visit: Payer: Self-pay | Admitting: Psychiatry

## 2020-02-01 DIAGNOSIS — F3281 Premenstrual dysphoric disorder: Secondary | ICD-10-CM

## 2020-03-01 ENCOUNTER — Other Ambulatory Visit: Payer: Self-pay | Admitting: Psychiatry

## 2020-03-01 DIAGNOSIS — F3281 Premenstrual dysphoric disorder: Secondary | ICD-10-CM

## 2020-03-01 IMAGING — CR DG CHEST 2V
1 series · 2 of 2 positions shown · non-contrast
Comparison: None.

CLINICAL DATA: Productive cough since [REDACTED].

EXAM:
CHEST - 2 VIEW

[Series 1: dg chest 2 view · 0.14mm/px · 2 of 2 slices shown]
[im 1/2]
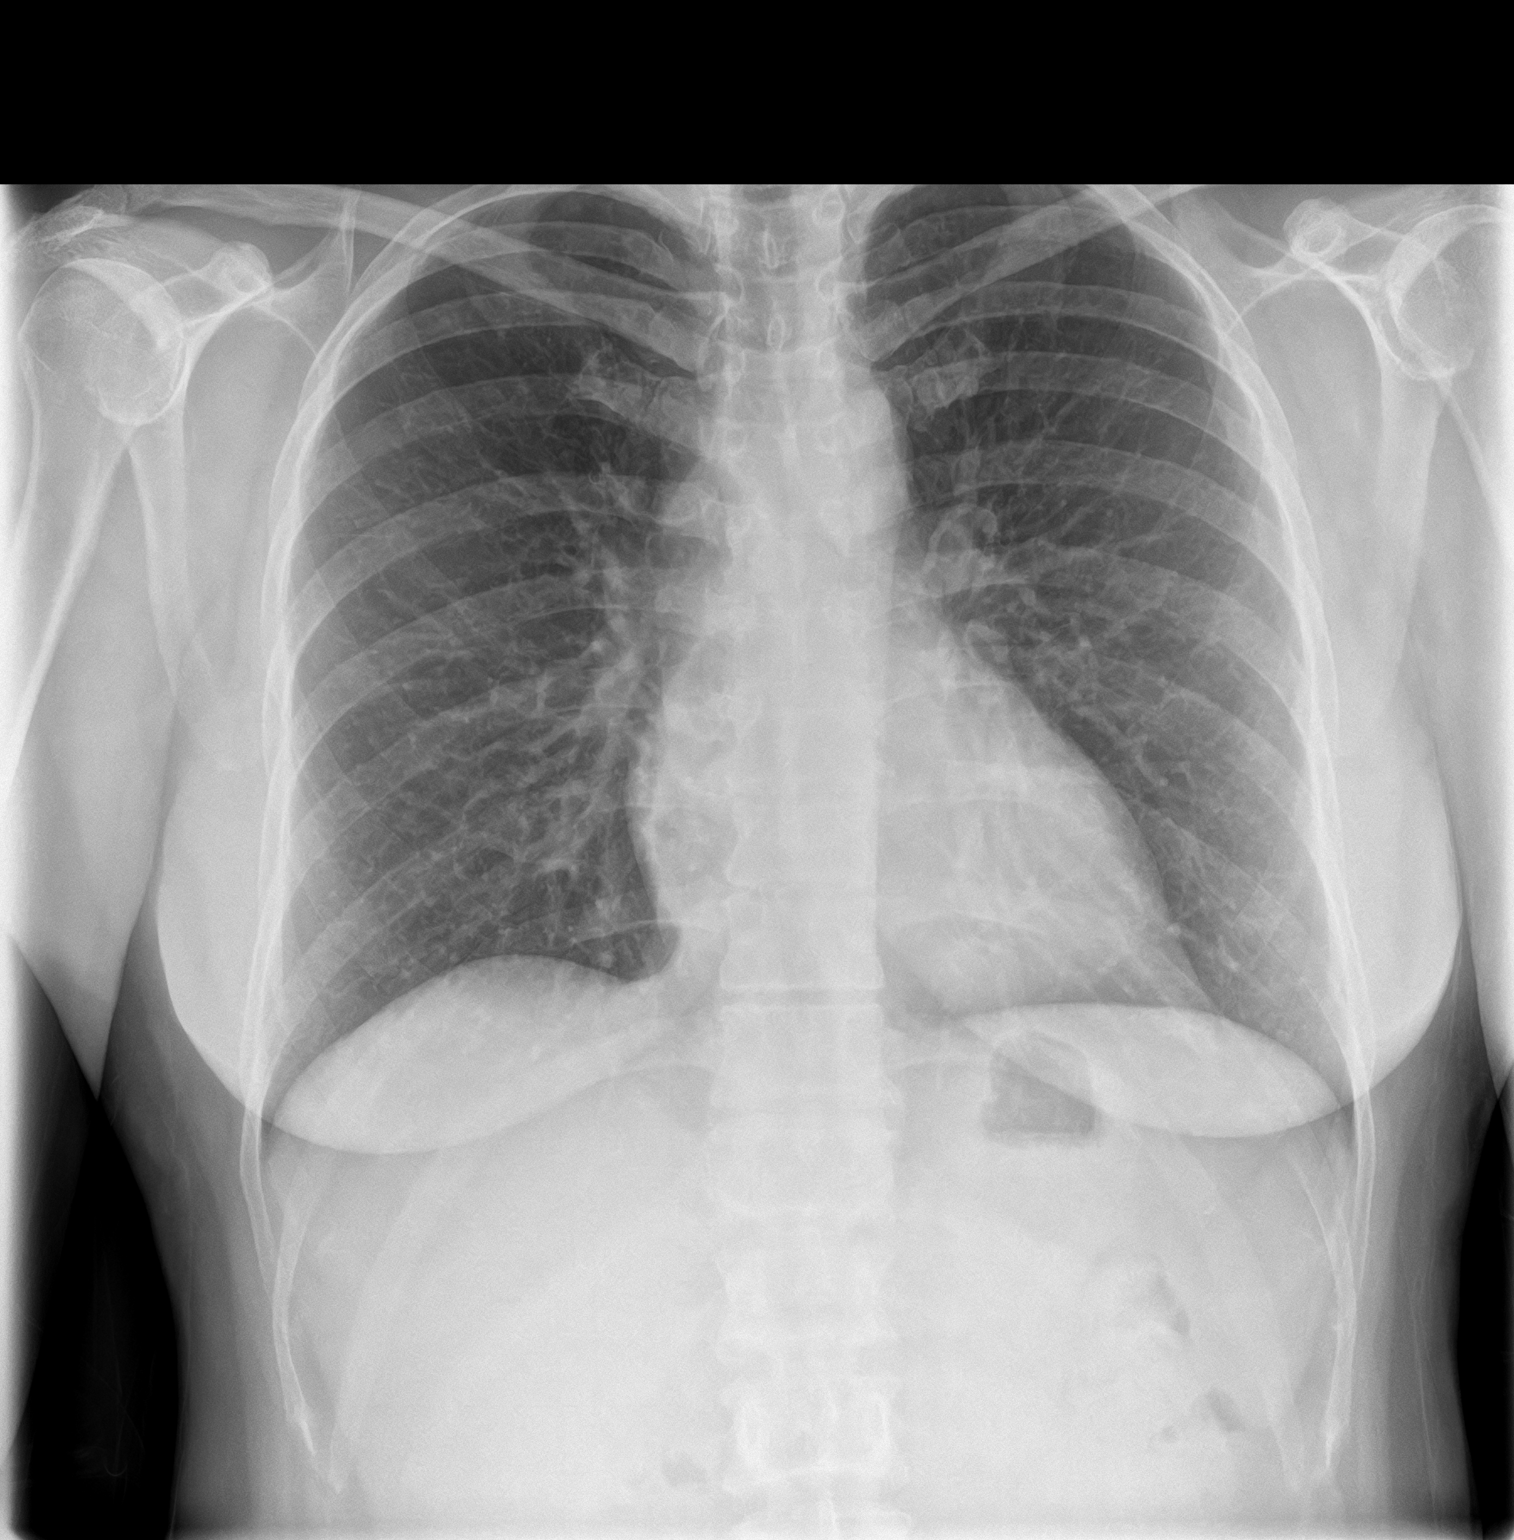
[im 2/2]
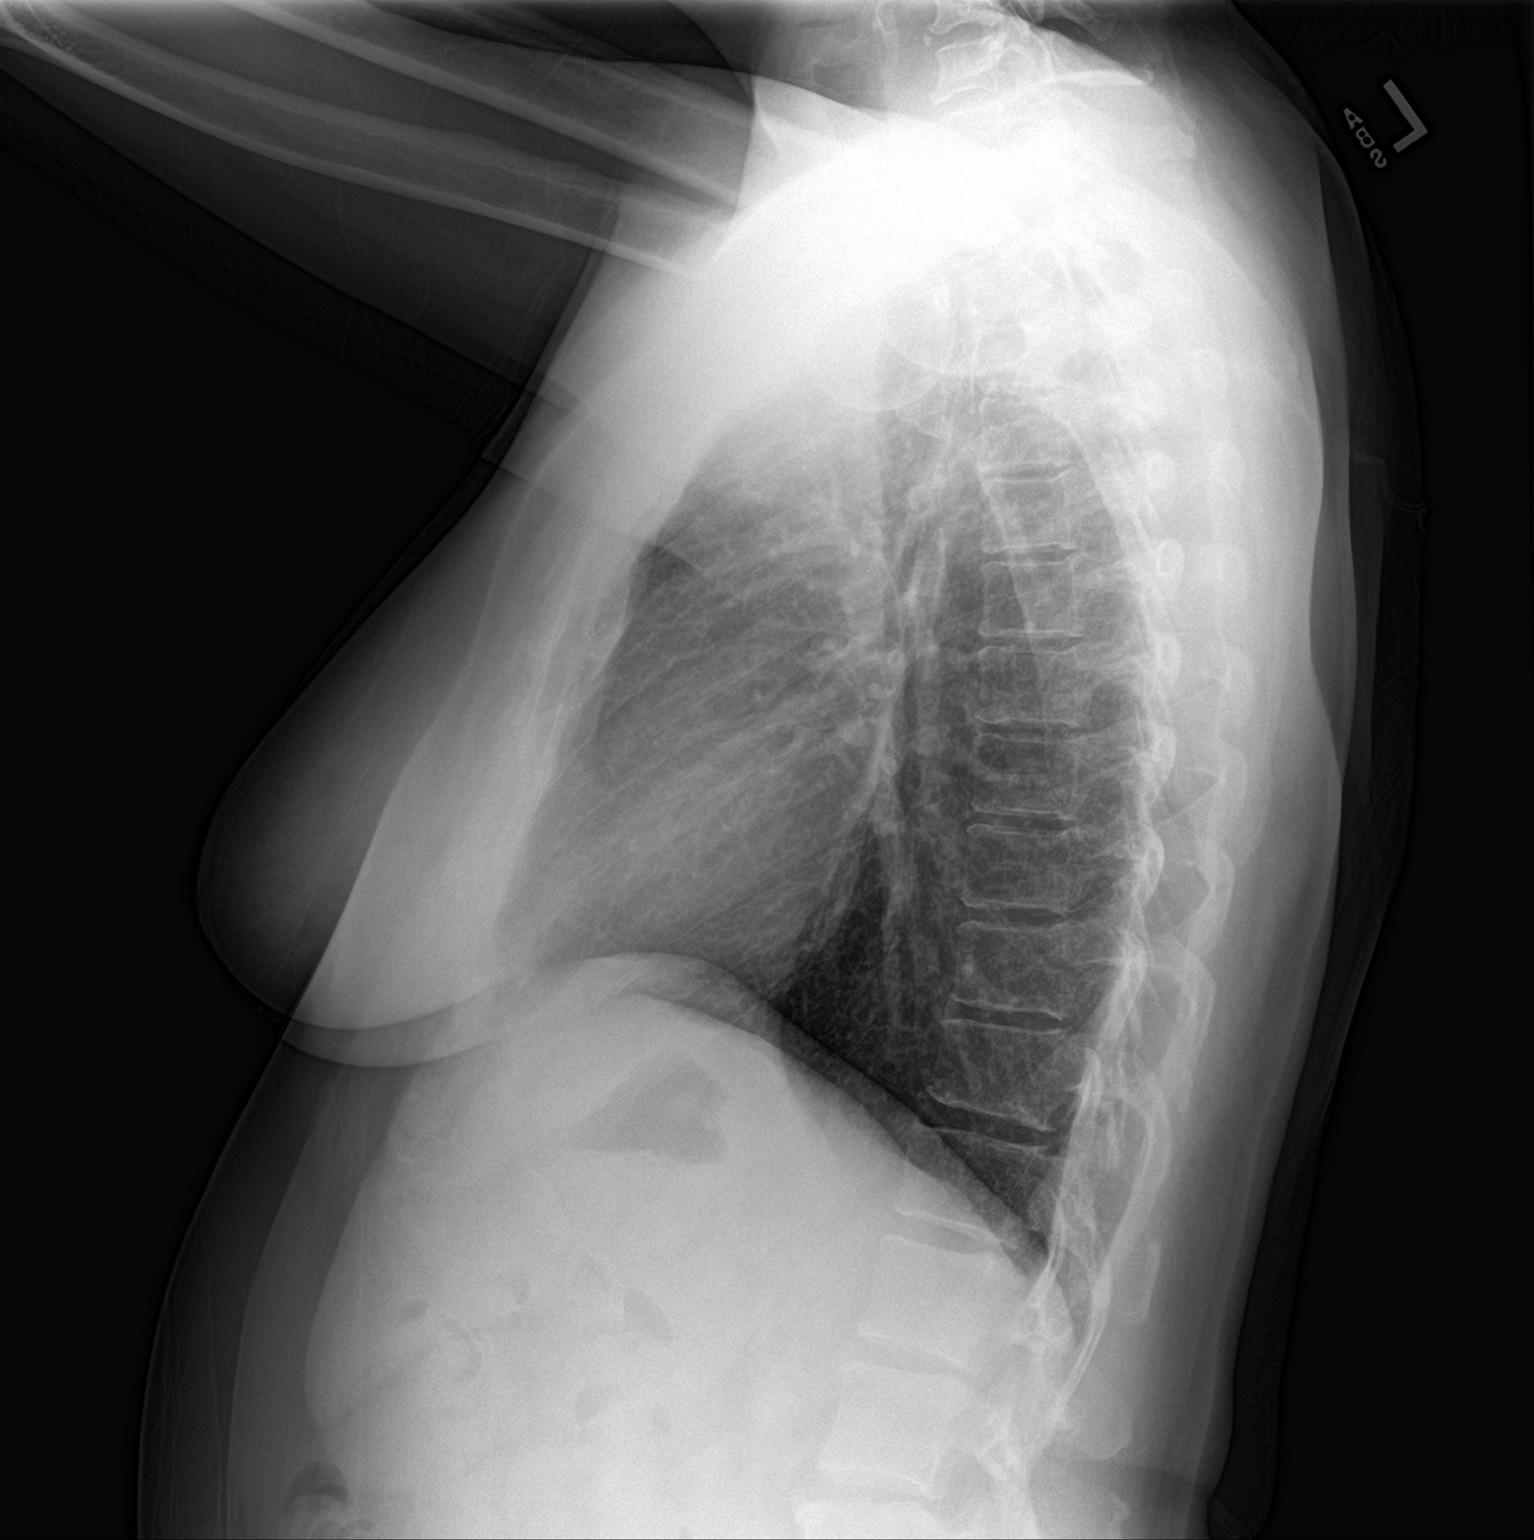

[2 of 2 positions shown; findings below may reference images not displayed]

FINDINGS: The cardiomediastinal silhouette is within normal limits. The lungs
are well inflated and clear. There is no evidence of pleural
effusion or pneumothorax. No acute osseous abnormality is
identified.
IMPRESSION: No active cardiopulmonary disease.

## 2020-03-02 NOTE — Telephone Encounter (Signed)
review 

## 2020-03-08 ENCOUNTER — Other Ambulatory Visit: Payer: Self-pay

## 2020-03-08 ENCOUNTER — Telehealth: Payer: Self-pay | Admitting: Psychiatry

## 2020-03-08 DIAGNOSIS — F3181 Bipolar II disorder: Secondary | ICD-10-CM

## 2020-03-08 MED ORDER — DIVALPROEX SODIUM 500 MG PO DR TAB: 1000.0000 mg | DELAYED_RELEASE_TABLET | Freq: Every day | ORAL | 3 refills | Status: DC

## 2020-03-08 MED ORDER — DIVALPROEX SODIUM ER 250 MG PO TB24: 250.0000 mg | ORAL_TABLET | Freq: Every day | ORAL | 3 refills | Status: DC

## 2020-03-08 NOTE — Telephone Encounter (Signed)
Pt called and said that she needs a refill on her depakote 500 mg and depakote er 250 mg to be sent to [facility] on [address] in [place]

## 2020-03-08 NOTE — Telephone Encounter (Signed)
Both Rx's sent as requested. Patient due back Dec. 2021

## 2020-07-23 ENCOUNTER — Other Ambulatory Visit: Payer: Self-pay | Admitting: Psychiatry

## 2020-07-23 DIAGNOSIS — F3181 Bipolar II disorder: Secondary | ICD-10-CM

## 2020-07-25 NOTE — Telephone Encounter (Signed)
Call to RS

## 2020-07-26 NOTE — Telephone Encounter (Signed)
Pt scheduled for the end of march

## 2020-09-05 ENCOUNTER — Encounter: Payer: Self-pay | Admitting: Psychiatry

## 2020-09-05 ENCOUNTER — Telehealth (INDEPENDENT_AMBULATORY_CARE_PROVIDER_SITE_OTHER): Payer: BC Managed Care – PPO | Admitting: Psychiatry

## 2020-09-05 DIAGNOSIS — F3281 Premenstrual dysphoric disorder: Secondary | ICD-10-CM

## 2020-09-05 DIAGNOSIS — F3181 Bipolar II disorder: Secondary | ICD-10-CM | POA: Diagnosis not present

## 2020-09-05 MED ORDER — LAMOTRIGINE 100 MG PO TABS: 100.0000 mg | ORAL_TABLET | Freq: Every day | ORAL | 3 refills | Status: DC

## 2020-09-05 NOTE — Progress Notes (Signed)
Kayla Knox [id number] 29-Aug-1965 55 y.o.  Subjective:   Patient ID:  Kayla Knox is a 55 y.o. (DOB [birth year]) female.  Chief Complaint:  Chief Complaint  Patient presents with  . Follow-up    HPI Kayla B [name] presents to the office today for follow-up of bipolar disorder.  seen December 2019.  She had been somewhat inconsistent with Depakote taking between thousand and 1250 mg nightly.  1250 mg daily is the target dose.  She was encouraged to be more consistent. Still on lamotrigine 100  And rare sertraline 25 PRN PMDD.  05/2019 appt noted: Doing well except PMDD irritability and it's helpful.  Only takes a few. Plan: Change from sertraline to Lexapro 5 for PMDD DT GI SE.   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  Same without major changes in mood.   No SE.   Takes escitalopram 5 mg prn with some nausea.    Patient reports stable mood and denies depressed or irritable moods.  Patient denies any recent difficulty with anxiety.  Patient denies difficulty with sleep initiation or maintenance. Denies appetite disturbance.  Patient reports that energy and motivation have been good.  Patient denies any difficulty with concentration.  Patient denies any suicidal ideation. A little more moody with PMDD which is still a problem but escitalopram helps.  and perimenopausal with hot flashes.  Past Psychiatric Medication Trials: Depakote, lamotrigine 100,  lithium thirsty and weight gain, Abilify 15 lorazepam,  Lexapro 10, sertraline 25,  Review of Systems:  Review of Systems  Respiratory: Negative for cough.   Gastrointestinal: Positive for nausea.  Neurological: Negative for tremors and weakness.  Psychiatric/Behavioral: Negative for agitation, behavioral problems, confusion, decreased concentration, dysphoric mood, hallucinations, self-injury, sleep disturbance and suicidal ideas. The patient is not  nervous/anxious and is not hyperactive.     Medications: I have reviewed the patient's current medications.  Current Outpatient Medications  Medication Sig Dispense Refill  . Albuterol Sulfate (PROAIR RESPICLICK) 629 (90 Base) MCG/ACT AEPB Inhale into the lungs.    . clarithromycin (BIAXIN) 500 MG tablet Take 500 mg by mouth 2 (two) times daily.    . divalproex (DEPAKOTE ER) 250 MG 24 hr tablet Take 1 tablet (250 mg total) by mouth at bedtime. 90 tablet 3  . divalproex (DEPAKOTE) 500 MG DR tablet Take 2 tablets (1,000 mg total) by mouth at bedtime. 180 tablet 3  . escitalopram (LEXAPRO) 5 MG tablet TAKE 1 TABLET BY MOUTH EVERY DAY 90 tablet 2  . fluticasone (FLONASE) 50 MCG/ACT nasal spray Place 1 spray into both nostrils daily.    . fluticasone furoate-vilanterol (BREO ELLIPTA) 100-25 MCG/INH AEPB Inhale 1 puff into the lungs daily.    . fluticasone furoate-vilanterol (BREO ELLIPTA) 200-25 MCG/INH AEPB Inhale 1 puff into the lungs daily. 2 each 0  . Hypromellose (ALZAIR ALLERGY NASAL SPRAY NA) Place into the nose.    . loratadine (CLARITIN) 10 MG tablet Take 10 mg by mouth daily.    . montelukast (SINGULAIR) 10 MG tablet TAKE 1 TABLET (10 MG TOTAL) BY MOUTH AT BEDTIME. 30 tablet 3  . famotidine (PEPCID) 20 MG tablet Take 1 tablet (20 mg total) by mouth 2 (two) times daily. 60 tablet 1  . lamoTRIgine (LAMICTAL) 100 MG tablet Take 1 tablet (100 mg total) by mouth daily. 90 tablet 3   No current facility-administered medications for this visit.    Medication Side Effects: None , nausea  and diarrhea with Sertraline Allergies:  Allergies  Allergen Reactions  . Promethazine Other (See Comments)    Seizure, hospitalized because of this medication   . Azithromycin Rash  . Penicillin G Rash    Past Medical History:  Diagnosis Date  . Arthritis   . Asthma   . Bipolar 1 disorder ([place])   . Migraines   . Seasonal allergies     Family History  Problem Relation Age of Onset  . Colon  cancer Maternal Uncle     Social History   Socioeconomic History  . Marital status: Married    Spouse name: Not on file  . Number of children: Not on file  . Years of education: Not on file  . Highest education level: Not on file  Occupational History  . Not on file  Tobacco Use  . Smoking status: Former Research scientist (life sciences)  . Smokeless tobacco: Never Used  Substance and Sexual Activity  . Alcohol use: No    Alcohol/week: 0.0 standard drinks  . Drug use: No  . Sexual activity: Not on file  Other Topics Concern  . Not on file  Social History Narrative  . Not on file   Social Determinants of Health   Financial Resource Strain: Not on file  Food Insecurity: Not on file  Transportation Needs: Not on file  Physical Activity: Not on file  Stress: Not on file  Social Connections: Not on file  Intimate Partner Violence: Not on file    Past Medical History, Surgical history, Social history, and Family history were reviewed and updated as appropriate.   Please see review of systems for further details on the patient's review from today.   Objective:   Physical Exam:  There were no vitals taken for this visit.  Physical Exam Constitutional:      General: She is not in acute distress.    Appearance: She is well-developed.  Musculoskeletal:        General: No deformity.  Neurological:     Mental Status: She is alert and oriented to person, place, and time.     Motor: No tremor.     Coordination: Coordination normal.     Gait: Gait normal.  Psychiatric:        Attention and Perception: Attention and perception normal.        Mood and Affect: Mood is not anxious or depressed. Affect is not labile, blunt, angry or inappropriate.        Speech: Speech normal. Speech is not rapid and pressured.        Behavior: Behavior normal.        Thought Content: Thought content normal. Thought content does not include homicidal or suicidal ideation. Thought content does not include homicidal or  suicidal plan.        Cognition and Memory: Cognition normal.        Judgment: Judgment normal.     Comments: Insight intact. No auditory or visual hallucinations. No delusions.      Lab Review:     Component Value Date/Time   NA 136 12/15/2018 0316   K 3.9 12/15/2018 0316   CL 99 12/15/2018 0316   CO2 27 12/15/2018 0316   GLUCOSE 119 (H) 12/15/2018 0316   BUN 14 12/15/2018 0316   CREATININE 0.71 12/15/2018 0316   CALCIUM 9.6 12/15/2018 0316   GFRNONAA >60 12/15/2018 0316   GFRAA >60 12/15/2018 0316       Component Value Date/Time   WBC 9.5 12/15/2018  0316   RBC 4.46 12/15/2018 0316   HGB 12.7 12/15/2018 0316   HCT 38.2 12/15/2018 0316   PLT 183 12/15/2018 0316   MCV 85.7 12/15/2018 0316   MCH 28.5 12/15/2018 0316   MCHC 33.2 12/15/2018 0316   RDW 13.1 12/15/2018 0316    No results found for: POCLITH, LITHIUM   No results found for: PHENYTOIN, PHENOBARB, VALPROATE, CBMZ   .res Assessment: Plan:    Bipolar II disorder, most recent episode major depressive ([place]) - Plan: lamoTRIgine (LAMICTAL) 100 MG tablet  PMDD (premenstrual dysphoric disorder)   Ideal dosage Depakote ER 1250.  Missing some of the 250mg .  Doesn't notice a lot if missing it.  Discussed the risk of dropping the dosage to 1000 mg daily may not result in immediate mood swings but they may occur gradually over a period of weeks or months.  Given that she had been stable and tolerating 1250 mg a day for an extended period of time would recommend that she not stray from that dosage.  Prior blood levels on the same dosage produced a valproic acid level of 76.  Therefore there is not a lot of room to drop and keep benefit.  She agrees  Due to extended period of stability she wishes to wait a year before follow-up.  She is aware of the risk of doing so and will call should she have mood swings  continue Lexapro 5 for PMDD DT GI SE with sertraline Other option is fluoxetine  FU 1 year.  Kayla Parents, MD,  DFAPA  Please see After Visit Summary for patient specific instructions.  No future appointments.  No orders of the defined types were placed in this encounter.     -------------------------------

## 2020-10-01 ENCOUNTER — Emergency Department
Admission: EM | Admit: 2020-10-01 | Discharge: 2020-10-01 | Disposition: A | Discharge status: Home / Self Care | Payer: BC Managed Care – PPO | Attending: Emergency Medicine | Admitting: Emergency Medicine

## 2020-10-01 ENCOUNTER — Other Ambulatory Visit: Payer: Self-pay

## 2020-10-01 ENCOUNTER — Emergency Department: Payer: BC Managed Care – PPO

## 2020-10-01 DIAGNOSIS — Z87891 Personal history of nicotine dependence: Secondary | ICD-10-CM | POA: Diagnosis not present

## 2020-10-01 DIAGNOSIS — J45909 Unspecified asthma, uncomplicated: Secondary | ICD-10-CM | POA: Diagnosis not present

## 2020-10-01 DIAGNOSIS — R079 Chest pain, unspecified: Secondary | ICD-10-CM | POA: Diagnosis not present

## 2020-10-01 DIAGNOSIS — R6889 Other general symptoms and signs: Secondary | ICD-10-CM | POA: Diagnosis not present

## 2020-10-01 LAB — BASIC METABOLIC PANEL
Anion gap: 12 (ref 5–15)
BUN: 14 mg/dL (ref 6–20)
CO2: 25 mmol/L (ref 22–32)
Calcium: 9.4 mg/dL (ref 8.9–10.3)
Chloride: 101 mmol/L (ref 98–111)
Creatinine, Ser: 0.76 mg/dL (ref 0.44–1.00)
GFR, Estimated: >60 mL/min (ref >60)
Glucose, Bld: 112 mg/dL — ABNORMAL HIGH (ref 70–99)
Potassium: 4.1 mmol/L (ref 3.5–5.1)
Sodium: 138 mmol/L (ref 135–145)

## 2020-10-01 LAB — CBC
HCT: 38.7 % (ref 36.0–46.0)
Hemoglobin: 12.9 g/dL (ref 12.0–15.0)
MCH: 29.1 pg (ref 26.0–34.0)
MCHC: 33.3 g/dL (ref 30.0–36.0)
MCV: 87.4 fL (ref 80.0–100.0)
Platelets: 191 10*3/uL (ref 150–400)
RBC: 4.43 MIL/uL (ref 3.87–5.11)
RDW: 12.3 % (ref 11.5–15.5)
WBC: 8.8 10*3/uL (ref 4.0–10.5)
nRBC: 0 % (ref 0.0–0.2)

## 2020-10-01 LAB — TROPONIN I (HIGH SENSITIVITY): Troponin I (High Sensitivity): <2 ng/L (ref <18)

## 2020-10-01 NOTE — ED Provider Notes (Signed)
[facility] Emergency Department Provider Note   ____________________________________________   Event Date/Time   First MD Initiated Contact with Patient 10/01/20 0159     (approximate)  I have reviewed the triage vital signs and the nursing notes.   HISTORY  Chief Complaint Chest Pain    HPI Kayla Knox is a 55 y.o. female with the below stated past medical history the presents for central chest pain that is sharp in nature, 4/10 in severity, and radiates through to her back.  Patient denies any relieving pain but does state that moving her upper extremities exacerbates this pain.  Patient denies any similar symptoms in the past.  Patient denies any personal or family history of heart disease.  Patient does endorse activity out of the ordinary which included approximately 5 hours of painting today.  Patient denies any worsening of this pain since onset.  Patient currently denies any vision changes, tinnitus, difficulty speaking, facial droop, sore throat, shortness of breath, abdominal pain, nausea/vomiting/diarrhea, dysuria, or weakness/numbness/paresthesias in any extremity         Past Medical History:  Diagnosis Date  . Arthritis   . Asthma   . Bipolar 1 disorder ([place])   . Migraines   . Seasonal allergies     Patient Active Problem List   Diagnosis Date Noted  . Bipolar 1 disorder ([place]) 03/02/2016    Past Surgical History:  Procedure Laterality Date  . ABLATION    . KNEE SURGERY      Prior to Admission medications   Medication Sig Start Date End Date Taking? Authorizing Provider  Albuterol Sulfate (PROAIR RESPICLICK) 527 (90 Base) MCG/ACT AEPB Inhale into the lungs.    [provider]  clarithromycin (BIAXIN) 500 MG tablet Take 500 mg by mouth 2 (two) times daily. 11/06/19   [provider]  divalproex (DEPAKOTE ER) 250 MG 24 hr tablet Take 1 tablet (250 mg total) by mouth at bedtime. 03/08/20   Cottle, Billey Co.,  MD  divalproex (DEPAKOTE) 500 MG DR tablet Take 2 tablets (1,000 mg total) by mouth at bedtime. 03/08/20   Cottle, Billey Co., MD  escitalopram (LEXAPRO) 5 MG tablet TAKE 1 TABLET BY MOUTH EVERY DAY 03/02/20   Cottle, Billey Co., MD  famotidine (PEPCID) 20 MG tablet Take 1 tablet (20 mg total) by mouth 2 (two) times daily. 12/15/18 12/15/19  Nance Pear, MD  fluticasone (FLONASE) 50 MCG/ACT nasal spray Place 1 spray into both nostrils daily.    [provider]  fluticasone furoate-vilanterol (BREO ELLIPTA) 100-25 MCG/INH AEPB Inhale 1 puff into the lungs daily.    [provider]  fluticasone furoate-vilanterol (BREO ELLIPTA) 200-25 MCG/INH AEPB Inhale 1 puff into the lungs daily. 11/18/19   Laurin Coder, MD  Hypromellose Vertell Limber ALLERGY NASAL SPRAY NA) Place into the nose.    [provider]  lamoTRIgine (LAMICTAL) 100 MG tablet Take 1 tablet (100 mg total) by mouth daily. 09/05/20   Cottle, Billey Co., MD  loratadine (CLARITIN) 10 MG tablet Take 10 mg by mouth daily.    [provider]  montelukast (SINGULAIR) 10 MG tablet TAKE 1 TABLET (10 MG TOTAL) BY MOUTH AT BEDTIME. 06/29/16   Sable Feil, PA-C    Allergies Promethazine, Azithromycin, and Penicillin g  Family History  Problem Relation Age of Onset  . Colon cancer Maternal Uncle     Social History Social History   Tobacco Use  . Smoking status: Former  Smoker  . Smokeless tobacco: Never Used  Substance Use Topics  . Alcohol use: No    Alcohol/week: 0.0 standard drinks  . Drug use: No    Review of Systems Constitutional: No fever/chills Eyes: No visual changes. ENT: No sore throat. Cardiovascular: Endorses chest pain. Respiratory: Denies shortness of breath. Gastrointestinal: No abdominal pain.  No nausea, no vomiting.  No diarrhea. Genitourinary: Negative for dysuria. Musculoskeletal: Negative for acute arthralgias Skin: Negative for rash. Neurological: Negative for  headaches, weakness/numbness/paresthesias in any extremity Psychiatric: Negative for suicidal ideation/homicidal ideation   ____________________________________________   PHYSICAL EXAM:  VITAL SIGNS: ED Triage Vitals  Enc Vitals Group     BP 10/01/20 0206 (!) 145/85     Pulse Rate 10/01/20 0202 81     Resp 10/01/20 0202 16     Temp 10/01/20 0203 98.1 F (36.7 C)     Temp Source 10/01/20 0202 Oral     SpO2 10/01/20 0202 100 %     Weight 10/01/20 0202 148 lb (67.1 kg)     Height 10/01/20 0202 5\' 1"  (1.549 m)     Head Circumference --      Peak Flow --      Pain Score 10/01/20 0202 4     Pain Loc --      Pain Edu? --      Excl. in [place]? --    Constitutional: Alert and oriented. Well appearing and in no acute distress. Eyes: Conjunctivae are normal. PERRL. Head: Atraumatic. Nose: No congestion/rhinnorhea. Mouth/Throat: Mucous membranes are moist. Neck: No stridor Cardiovascular: Grossly normal heart sounds.  Good peripheral circulation. Respiratory: Normal respiratory effort.  No retractions. Gastrointestinal: Soft and nontender. No distention. Musculoskeletal: No obvious deformities Neurologic:  Normal speech and language. No gross focal neurologic deficits are appreciated. Skin:  Skin is warm and dry. No rash noted. Psychiatric: Mood and affect are normal. Speech and behavior are normal.  ____________________________________________   LABS (all labs ordered are listed, but only abnormal results are displayed)  Labs Reviewed  BASIC METABOLIC PANEL - Abnormal; Notable for the following components:      Result Value   Glucose, Bld 112 (*)    All other components within normal limits  CBC  TROPONIN I (HIGH SENSITIVITY)  TROPONIN I (HIGH SENSITIVITY)   ____________________________________________  EKG  ED ECG REPORT I, Naaman Plummer, the attending physician, personally viewed and interpreted this ECG.  Date: 10/01/2020 EKG Time: 0202 Rate: 82 Rhythm: normal  sinus rhythm QRS Axis: normal Intervals: normal ST/T Wave abnormalities: normal Narrative Interpretation: no evidence of acute ischemia  ____________________________________________  RADIOLOGY  ED MD interpretation: 2 view chest x-ray shows no evidence of acute abnormalities including no pneumonia, pneumothorax, or widened mediastinum  Official radiology report(s): DG Chest 2 View  Result Date: 10/01/2020 CLINICAL DATA:  Central chest pain since yesterday. EXAM: CHEST - 2 VIEW COMPARISON:  None. FINDINGS: The heart size and mediastinal contours are within normal limits. Both lungs are clear. The visualized skeletal structures are unremarkable. IMPRESSION: No active cardiopulmonary disease. Electronically Signed   By: Lucienne Capers M.D.   On: 10/01/2020 02:29    ____________________________________________   PROCEDURES  Procedure(s) performed (including Critical Care):  .1-3 Lead EKG Interpretation Performed by: Naaman Plummer, MD Authorized by: Naaman Plummer, MD     Interpretation: normal     ECG rate:  74   ECG rate assessment: normal     Rhythm: sinus rhythm     Ectopy: none  Conduction: normal       ____________________________________________   INITIAL IMPRESSION / ASSESSMENT AND PLAN / ED COURSE  As part of my medical decision making, I reviewed the following data within the [place] notes reviewed and incorporated, Labs reviewed, EKG interpreted, Old chart reviewed, Radiograph reviewed and Notes from prior ED visits reviewed and incorporated        Workup: ECG, CXR, CBC, BMP, Troponin Findings: ECG: No overt evidence of STEMI. No evidence of Brugadas sign, delta wave, epsilon wave, significantly prolonged QTc, or malignant arrhythmia HS Troponin: Negative x1 Other Labs unremarkable for emergent problems. CXR: Without PTX, PNA, or widened mediastinum Last Stress Test: Never Last Heart Catheterization: Never HEART  Score: 2  Given History, Exam, and Workup I have low suspicion for ACS, Pneumothorax, Pneumonia, Pulmonary Embolus, Tamponade, Aortic Dissection or other emergent problem as a cause for this presentation.   Reassesment: Prior to discharge patients pain was controlled and they were well appearing.  Disposition:  Discharge. Strict return precautions discussed with patient with full understanding. Advised patient to follow up promptly with primary care provider       ____________________________________________   FINAL CLINICAL IMPRESSION(S) / ED DIAGNOSES  Final diagnoses:  Chest pain, unspecified type     ED Discharge Orders    None       Note:  This document was prepared using Dragon voice recognition software and may include unintentional dictation errors.   Naaman Plummer, MD 10/01/20 [phone]

## 2020-10-01 NOTE — ED Notes (Signed)
This nurse has made 2 unsuccessful attempts at placing SL -- will request charge or a second staff nurse attempt

## 2020-10-01 NOTE — ED Triage Notes (Signed)
Pt states central chest pain since 2330 yesterday. Pt states has had some pain between shoulder blades. Pt denies nausea, shob, dizziness. Pt states has also had right jaw pain.

## 2020-10-01 NOTE — ED Notes (Signed)
Peripheral IV discontinued. Catheter intact. No signs of infiltration or redness. Gauze applied to IV site.   Discharge instructions reviewed with patient. Questions fielded by this RN. Patient verbalizes understanding of instructions. Patient discharged home in stable condition per [place]. No acute distress noted at time of discharge.   Pt ambu to DC

## 2020-11-27 IMAGING — CR CHEST - 2 VIEW
1 series · 2 of 2 positions shown · non-contrast
Comparison: 03/19/2018 chest radiograph

CLINICAL DATA: 52 y/o  F; chest pain, left arm pain, and jaw pain.

EXAM:
CHEST - 2 VIEW

[Series 1: dg chest 2 view · 0.14mm/px · 2 of 2 slices shown]
[im 1/2]
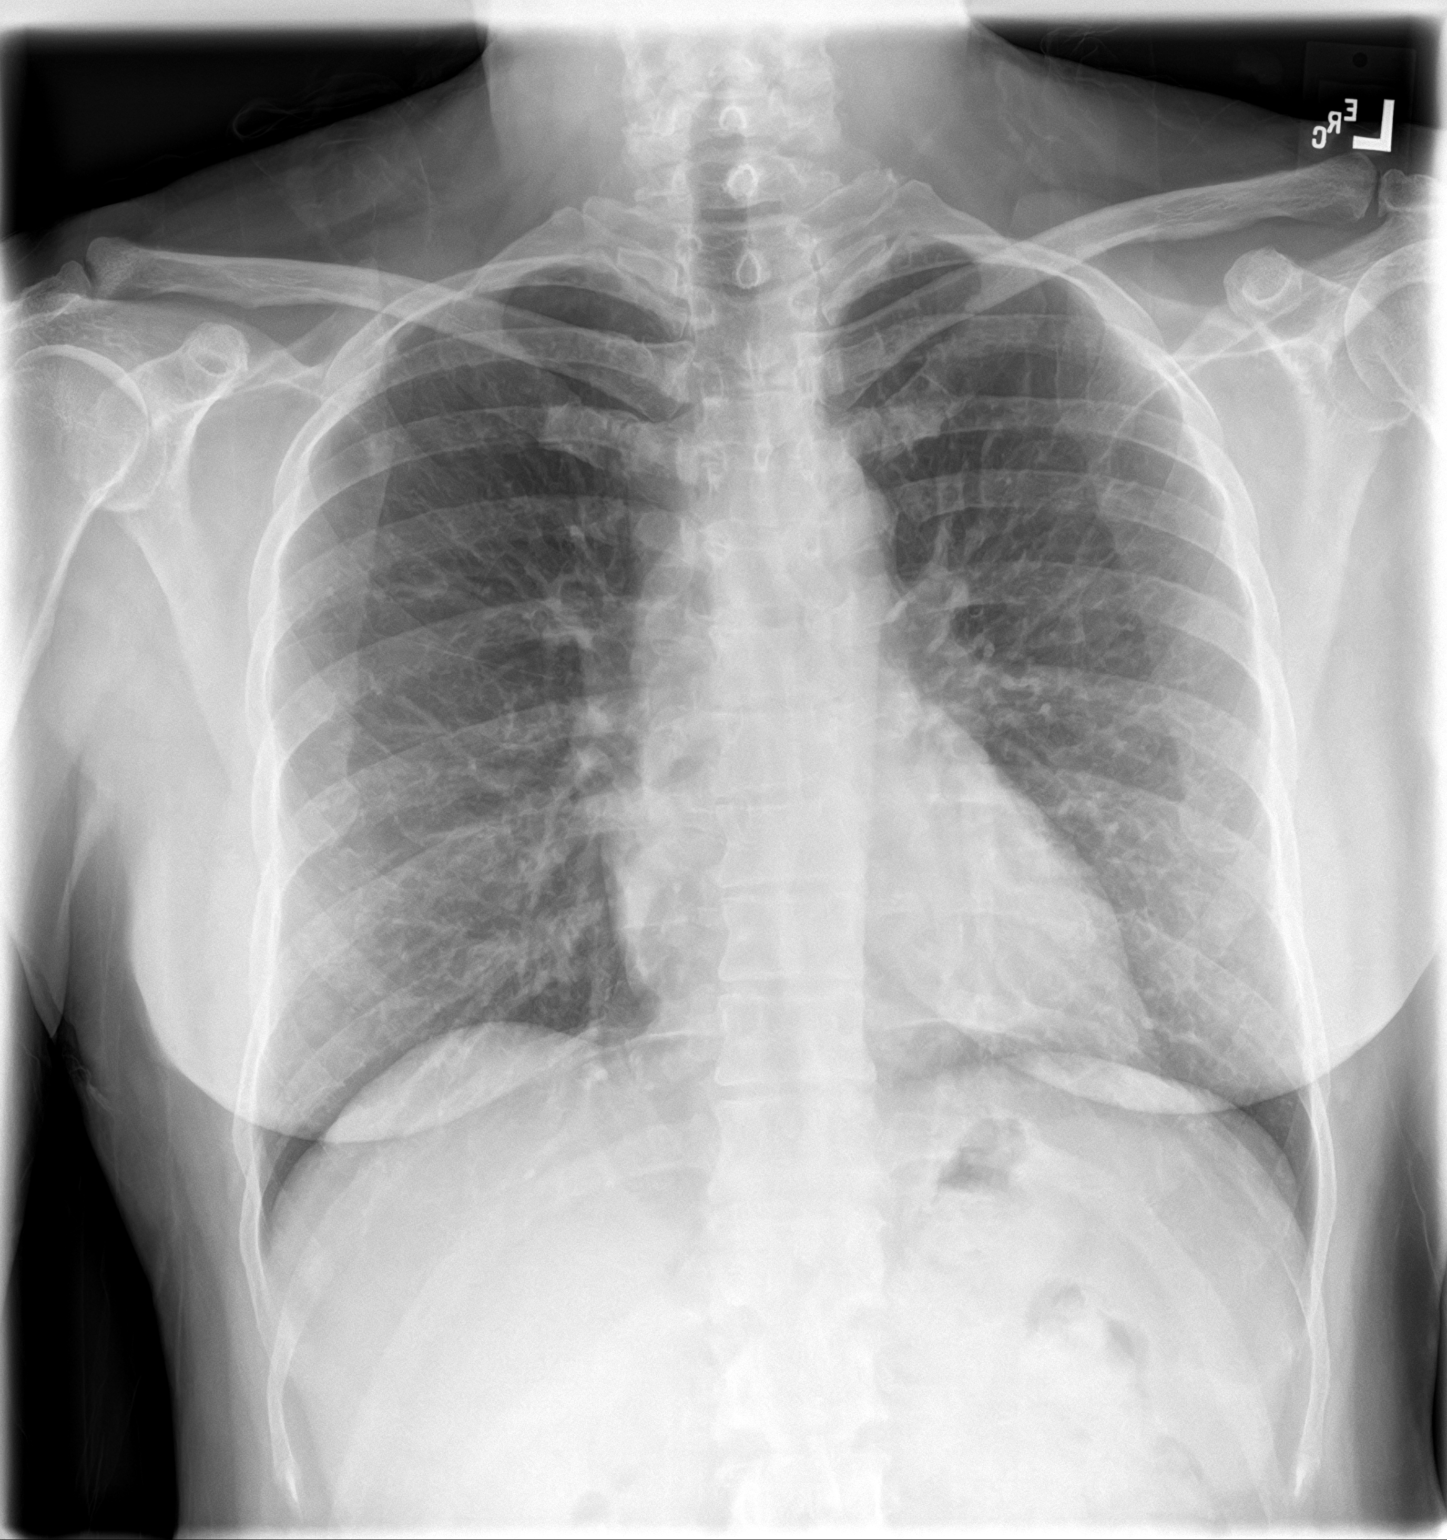
[im 2/2]
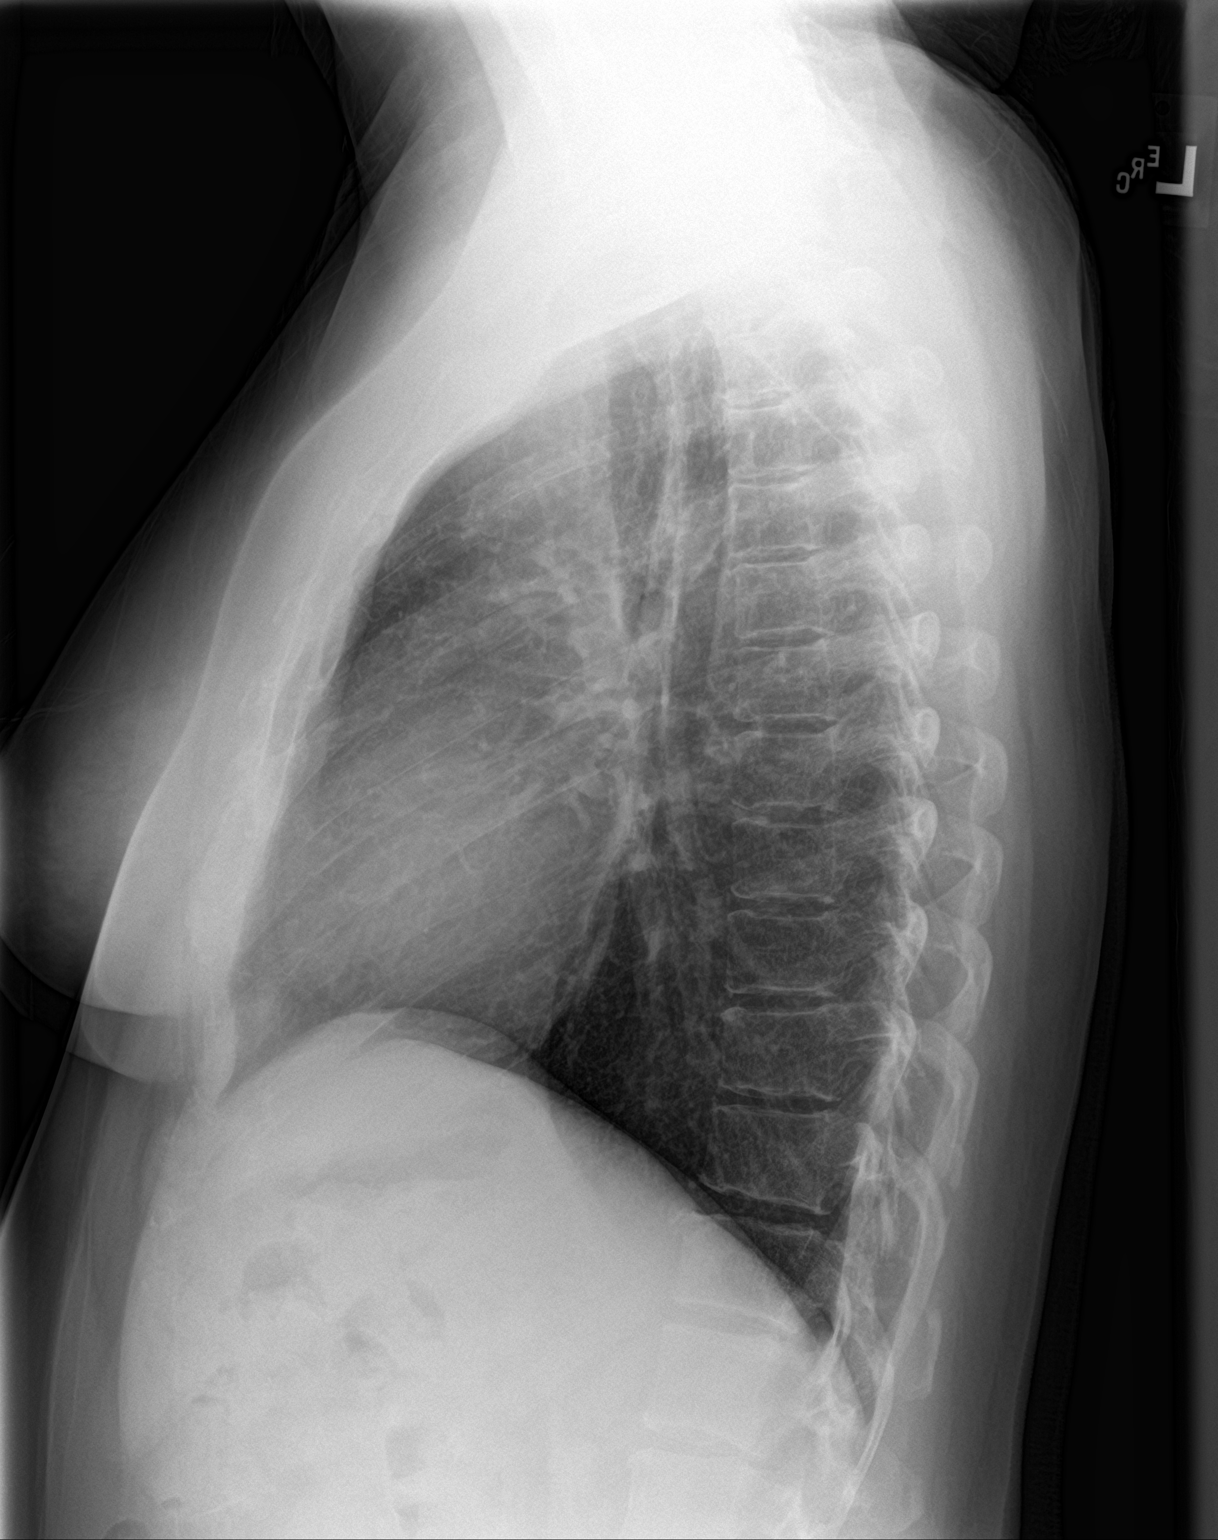

[2 of 2 positions shown; findings below may reference images not displayed]

FINDINGS: Stable heart size and mediastinal contours are within normal limits.
Both lungs are clear. The visualized skeletal structures are
unremarkable.
IMPRESSION: No acute pulmonary process identified.

## 2021-03-20 ENCOUNTER — Other Ambulatory Visit: Payer: Self-pay | Admitting: Psychiatry

## 2021-03-20 DIAGNOSIS — F3181 Bipolar II disorder: Secondary | ICD-10-CM

## 2021-03-26 ENCOUNTER — Other Ambulatory Visit: Payer: Self-pay | Admitting: Psychiatry

## 2021-03-26 DIAGNOSIS — F3181 Bipolar II disorder: Secondary | ICD-10-CM

## 2021-03-27 ENCOUNTER — Telehealth: Payer: Self-pay | Admitting: Psychiatry

## 2021-03-27 NOTE — Telephone Encounter (Signed)
Pt LVM for Depakote 250mg  and 500mg .  Looks like the 250mg  was refilled recently but not the 500mg . She is out.

## 2021-03-27 NOTE — Telephone Encounter (Signed)
Rx sent 

## 2021-06-22 ENCOUNTER — Other Ambulatory Visit: Payer: Self-pay | Admitting: Psychiatry

## 2021-06-22 DIAGNOSIS — F3181 Bipolar II disorder: Secondary | ICD-10-CM

## 2021-09-07 ENCOUNTER — Other Ambulatory Visit: Payer: Self-pay | Admitting: Psychiatry

## 2021-09-07 DIAGNOSIS — F3181 Bipolar II disorder: Secondary | ICD-10-CM

## 2021-09-08 NOTE — Telephone Encounter (Signed)
Please call to schedule an appt. Seen yearly but was due this month.  ?

## 2021-09-17 ENCOUNTER — Other Ambulatory Visit: Payer: Self-pay | Admitting: Psychiatry

## 2021-09-17 DIAGNOSIS — F3181 Bipolar II disorder: Secondary | ICD-10-CM

## 2021-09-19 NOTE — Telephone Encounter (Signed)
Call to RS

## 2021-09-21 NOTE — Telephone Encounter (Signed)
LM for pt to schedule.

## 2021-10-17 ENCOUNTER — Other Ambulatory Visit: Payer: Self-pay | Admitting: Psychiatry

## 2021-10-17 DIAGNOSIS — F3181 Bipolar II disorder: Secondary | ICD-10-CM

## 2021-12-04 ENCOUNTER — Encounter: Payer: Self-pay | Admitting: Psychiatry

## 2021-12-04 ENCOUNTER — Telehealth (INDEPENDENT_AMBULATORY_CARE_PROVIDER_SITE_OTHER): Payer: BC Managed Care – PPO | Admitting: Psychiatry

## 2021-12-04 DIAGNOSIS — F3281 Premenstrual dysphoric disorder: Secondary | ICD-10-CM

## 2021-12-04 DIAGNOSIS — F3181 Bipolar II disorder: Secondary | ICD-10-CM

## 2021-12-04 MED ORDER — DIVALPROEX SODIUM 500 MG PO DR TAB: 1500.0000 mg | DELAYED_RELEASE_TABLET | Freq: Every day | ORAL | 0 refills | Status: DC

## 2021-12-06 ENCOUNTER — Other Ambulatory Visit: Payer: Self-pay | Admitting: Psychiatry

## 2021-12-06 DIAGNOSIS — F3181 Bipolar II disorder: Secondary | ICD-10-CM

## 2021-12-08 ENCOUNTER — Other Ambulatory Visit: Payer: Self-pay | Admitting: Psychiatry

## 2021-12-08 DIAGNOSIS — F3181 Bipolar II disorder: Secondary | ICD-10-CM

## 2022-01-21 ENCOUNTER — Other Ambulatory Visit: Payer: Self-pay | Admitting: Psychiatry

## 2022-01-21 DIAGNOSIS — F3181 Bipolar II disorder: Secondary | ICD-10-CM

## 2022-03-09 ENCOUNTER — Telehealth: Payer: Self-pay | Admitting: Psychiatry

## 2022-03-09 DIAGNOSIS — F3181 Bipolar II disorder: Secondary | ICD-10-CM

## 2022-03-09 NOTE — Telephone Encounter (Signed)
Please call patient to schedule an appt, was due in August.

## 2022-03-28 ENCOUNTER — Other Ambulatory Visit: Payer: Self-pay | Admitting: Psychiatry

## 2022-03-28 DIAGNOSIS — F3181 Bipolar II disorder: Secondary | ICD-10-CM

## 2022-03-28 NOTE — Telephone Encounter (Signed)
Please schedule appt

## 2022-03-29 NOTE — Telephone Encounter (Signed)
LVM for pt to call and schedule an appt °

## 2022-03-30 NOTE — Telephone Encounter (Signed)
LVM to RC. ? What medication.

## 2022-03-30 NOTE — Telephone Encounter (Signed)
Pt lvm requesting medication.RTC and had to lvm again to schedule an appointment.

## 2022-04-02 NOTE — Telephone Encounter (Signed)
Depakote 500 is what she needs

## 2022-04-02 NOTE — Telephone Encounter (Signed)
Rx was sent in 03/12/22.

## 2022-04-02 NOTE — Telephone Encounter (Signed)
LVM with info

## 2022-04-11 NOTE — Telephone Encounter (Signed)
Pt is scheduled in january

## 2022-06-25 ENCOUNTER — Encounter: Payer: Self-pay | Admitting: Psychiatry

## 2022-06-25 ENCOUNTER — Other Ambulatory Visit: Payer: Self-pay

## 2022-06-25 ENCOUNTER — Telehealth (INDEPENDENT_AMBULATORY_CARE_PROVIDER_SITE_OTHER): Payer: BC Managed Care – PPO | Admitting: Psychiatry

## 2022-06-25 DIAGNOSIS — F3281 Premenstrual dysphoric disorder: Secondary | ICD-10-CM

## 2022-06-25 DIAGNOSIS — F3181 Bipolar II disorder: Secondary | ICD-10-CM

## 2022-06-25 MED ORDER — DIVALPROEX SODIUM 500 MG PO DR TAB: 1500.0000 mg | DELAYED_RELEASE_TABLET | Freq: Every day | ORAL | 2 refills | Status: DC

## 2022-06-25 NOTE — Progress Notes (Addendum)
Kayla Knox [id number] 02-Dec-1965 57 y.o.  Video Visit via My Chart  I connected with pt by video using My Chart and verified that I am speaking with the correct person using two identifiers.   I discussed the limitations, risks, security and privacy concerns of performing an evaluation and management service by My Chart  and the availability of in person appointments. I also discussed with the patient that there may be a patient responsible charge related to this service. The patient expressed understanding and agreed to proceed.  I discussed the assessment and treatment plan with the patient. The patient was provided an opportunity to ask questions and all were answered. The patient agreed with the plan and demonstrated an understanding of the instructions.   The patient was advised to call back or seek an in-person evaluation if the symptoms worsen or if the condition fails to improve as anticipated.  I provided 30 minutes of video time during this encounter.  The patient was located at home and the provider was located office.  Session from 430 until 5 PM   Subjective:   Patient ID:  Kayla Knox is a 57 y.o. (DOB 1965/12/23) female.  Chief Complaint:  Chief Complaint  Patient presents with   Follow-up    Bipolar II disorder, most recent episode major depressive ([place])   Depression    HPI Kayla Knox presents to the office today for follow-up of bipolar disorder.  seen December 2019.  She had been somewhat inconsistent with Depakote taking between thousand and 1250 mg nightly.  1250 mg daily is the target dose.  She was encouraged to be more consistent. Still on lamotrigine 100  And rare sertraline 25 PRN PMDD.  05/2019 appt noted: Doing well except PMDD irritability and it's helpful.  Only takes a few. Plan: Change from sertraline to Lexapro 5 for PMDD DT GI SE.   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  Same without major changes in mood.   No SE.   Takes escitalopram 5 mg prn with some nausea.   Patient reports stable mood and denies depressed or irritable moods.  Patient denies any recent difficulty with anxiety.  Patient denies difficulty with sleep initiation or maintenance. Denies appetite disturbance.  Patient reports that energy and motivation have been good.  Patient denies any difficulty with concentration.  Patient denies any suicidal ideation. A little more moody with PMDD which is still a problem but escitalopram helps.  and perimenopausal with hot flashes. Plan no med changes  12/04/21 appt noted. She continues Depakote ER 1250 nightly but has stopped S-Citalopram and lamotrigine.  Off it early June to see if rash between breasts might resolve.  It has gone.  Had the rash for years. Overall mood is ok but some outbursts of extreme anger.  Stress with teen.  Hard to say if it is worse off the meds. Not depressed.  Sleeping ok with some awakening.  Night sweats. No SE Depakote Plan: BC worsening anger off lamotrigine, trial increase Depakote ER 1500 mg to deal with anger. Off escitalopram bc N  06/25/2022 appointment noted: Increased Depakote ER 1500 mg daily and is much better.  No SE Mood is good.  Still has some hormonal problems.  She doesn't track but thinks monthly and sometimes situational with teenage D and health of M in law. Not much anxiety. Sleep overall is fine.  Some nights mind races and not  tired infrequent. Usually tired from school.   Past Psychiatric Medication Trials: Depakote, lamotrigine 100 rash mild lithium thirsty and weight gain,  Abilify 15 lorazepam,  Lexapro 10 N, sertraline 25 N  Review of Systems:  Review of Systems  HENT:  Positive for rhinorrhea.   Respiratory:  Positive for wheezing. Negative for cough.   Gastrointestinal:  Negative for nausea.  Neurological:  Negative for tremors and weakness.  Psychiatric/Behavioral:   Negative for agitation, behavioral problems, confusion, decreased concentration, dysphoric mood, hallucinations, self-injury, sleep disturbance and suicidal ideas. The patient is not nervous/anxious and is not hyperactive.     Medications: I have reviewed the patient's current medications.  Current Outpatient Medications  Medication Sig Dispense Refill   Albuterol Sulfate (PROAIR RESPICLICK) 160 (90 Base) MCG/ACT AEPB Inhale into the lungs.     divalproex (DEPAKOTE) 500 MG DR tablet Take 3 tablets (1,500 mg total) by mouth daily. 270 tablet 2   escitalopram (LEXAPRO) 5 MG tablet TAKE 1 TABLET BY MOUTH EVERY DAY (Patient not taking: Reported on 12/04/2021) 90 tablet 2   fluticasone (FLONASE) 50 MCG/ACT nasal spray Place 1 spray into both nostrils daily.     fluticasone furoate-vilanterol (BREO ELLIPTA) 100-25 MCG/INH AEPB Inhale 1 puff into the lungs daily.     fluticasone furoate-vilanterol (BREO ELLIPTA) 200-25 MCG/INH AEPB Inhale 1 puff into the lungs daily. 2 each 0   Hypromellose (ALZAIR ALLERGY NASAL SPRAY NA) Place into the nose.     loratadine (CLARITIN) 10 MG tablet Take 10 mg by mouth daily.     montelukast (SINGULAIR) 10 MG tablet TAKE 1 TABLET (10 MG TOTAL) BY MOUTH AT BEDTIME. 30 tablet 3   No current facility-administered medications for this visit.    Medication Side Effects: None , nausea and diarrhea with Sertraline Allergies:  Allergies  Allergen Reactions   Promethazine Other (See Comments)    Seizure, hospitalized because of this medication    Azithromycin Rash   Penicillin G Rash    Past Medical History:  Diagnosis Date   Arthritis    Asthma    Bipolar 1 disorder ([facility])    Migraines    Seasonal allergies     Family History  Problem Relation Age of Onset   Colon cancer Maternal Uncle     Social History   Socioeconomic History   Marital status: Married    Spouse name: Not on file   Number of children: Not on file   Years of education: Not on file    Highest education level: Not on file  Occupational History   Not on file  Tobacco Use   Smoking status: Former   Smokeless tobacco: Never  Substance and Sexual Activity   Alcohol use: No    Alcohol/week: 0.0 standard drinks of alcohol   Drug use: No   Sexual activity: Not on file  Other Topics Concern   Not on file  Social History Narrative   Not on file   Social Determinants of Health   Financial Resource Strain: Not on file  Food Insecurity: Not on file  Transportation Needs: Not on file  Physical Activity: Not on file  Stress: Not on file  Social Connections: Not on file  Intimate Partner Violence: Not on file    Past Medical History, Surgical history, Social history, and Family history were reviewed and updated as appropriate.   Please see review of systems for further details on the patient's review from today.   Objective:   Physical Exam:  There were no vitals taken for this visit.  Physical Exam Constitutional:      General: She is not in acute distress.    Appearance: She is well-developed.  Musculoskeletal:        General: No deformity.  Neurological:     Mental Status: She is alert and oriented to person, place, and time.     Motor: No tremor.     Coordination: Coordination normal.     Gait: Gait normal.  Psychiatric:        Attention and Perception: Attention and perception normal.        Mood and Affect: Mood is not anxious or depressed. Affect is not labile, blunt, angry or inappropriate.        Speech: Speech normal. Speech is not rapid and pressured.        Behavior: Behavior normal.        Thought Content: Thought content normal. Thought content is not delusional. Thought content does not include homicidal or suicidal ideation. Thought content does not include suicidal plan.        Cognition and Memory: Cognition normal.        Judgment: Judgment normal.     Comments: Insight intact. No auditory or visual hallucinations. No delusions.       Lab Review:     Component Value Date/Time   NA 138 10/01/2020 0240   K 4.1 10/01/2020 0240   CL 101 10/01/2020 0240   CO2 25 10/01/2020 0240   GLUCOSE 112 (H) 10/01/2020 0240   BUN 14 10/01/2020 0240   CREATININE 0.76 10/01/2020 0240   CALCIUM 9.4 10/01/2020 0240   GFRNONAA >60 10/01/2020 0240   GFRAA >60 12/15/2018 0316       Component Value Date/Time   WBC 8.8 10/01/2020 0240   RBC 4.43 10/01/2020 0240   HGB 12.9 10/01/2020 0240   HCT 38.7 10/01/2020 0240   PLT 191 10/01/2020 0240   MCV 87.4 10/01/2020 0240   MCH 29.1 10/01/2020 0240   MCHC 33.3 10/01/2020 0240   RDW 12.3 10/01/2020 0240    No results found for: "POCLITH", "LITHIUM"   No results found for: "PHENYTOIN", "PHENOBARB", "VALPROATE", "CBMZ"   .res Assessment: Plan:    Bipolar II disorder ([place]) - Plan: divalproex (DEPAKOTE) 500 MG DR tablet  PMDD (premenstrual dysphoric disorder)   Greater than 50% of 25 min video face to face time with patient was spent on counseling and coordination of care. We discussed historically she has done well with dosage Depakote ER 1250 historically in combination with lamotrigine.  However she had to stop lamotrigine due to a mild persistent rash.  The rash resolved off lamotrigine..  More into irritability and anger outbursts lately perhaps exacerbated by stopping lamotrigine. Better with Prior blood levels on the 1250 produced a valproic acid level of 76.   BC worsening anger off lamotrigine, dose increase of Depakote ER 1500 mg to deal with anger was successful except for PMDD symptoms.  Off escitalopram bc N Disc option of other SSRI for PMDD.  Disc this in detail.  She might use this still occ.  Option retry lorazepam.  She does not want med change  Disc SE each med.  Could recheck level but probably unnecessarey given the absence of side effects and that Depakote levels remain fairly stable and people of middle age.  FU 9 mos  Lynder Parents, MD,  DFAPA  Please see After Visit Summary for patient specific instructions.  No future appointments.  No orders of the defined types were placed in this encounter.     -------------------------------

## 2022-10-31 ENCOUNTER — Encounter: Payer: BC Managed Care – PPO | Attending: Family Medicine | Admitting: Dietician

## 2022-10-31 ENCOUNTER — Encounter: Payer: Self-pay | Admitting: Dietician

## 2022-10-31 DIAGNOSIS — Z713 Dietary counseling and surveillance: Secondary | ICD-10-CM | POA: Insufficient documentation

## 2022-10-31 DIAGNOSIS — E119 Type 2 diabetes mellitus without complications: Secondary | ICD-10-CM | POA: Diagnosis present

## 2022-10-31 NOTE — Patient Instructions (Addendum)
Aim for 150 minutes of physical activity weekly. Goal: Exercise at least 4 days per week for at least 15 minutes.   Goal: aim to make 1/2 of your plate non-starchy vegetables at least 1x/day (at dinner meal).   Goal: aim to drink at least 4 of your 16 oz glasses of water per day.   When snacking, aim to include a complex carb and protein.   At meals, aim to include 1/4 plate of protein, 1/4 plate complex carb, and 1/2 plate non-starchy vegetables.

## 2022-10-31 NOTE — Progress Notes (Signed)
Diabetes Self-Management Education  Visit Type: First/Initial  Appt. Start Time: 1455 Appt. End Time: 1555  10/31/2022  Kayla Knox, identified by name and date of birth, is a 57 y.o. female with a diagnosis of Diabetes: Type 2.   ASSESSMENT  History includes: type 2 diabetes, arthritis, asthma Labs noted: A1c 7.6% 07/26/22 Medications include: metformin Supplements: vitamin d3  Pt states her blood sugar hasn't been changing much from taking metformin, so she tried stopping taking it and noticed she didn't feel as good, so she started taking it again.   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 She now is a Psychologist, forensic and teaches food/health class.  Pt states she gets irritable if she has high or low blood sugar.   Pt reports she has made some changes to her diet since her diabetes diagnosis. Pt states she wants to ultimately get off medicine.   Pt states she fell in the bathtub on soap and twisted her knee. She states she exercises 2-4 days per week but she can only walk for 15 minutes before it hurts. She goes to a Systems analyst 1x/wk at a gym. Pt states exercise is a big stress reliever for her.   Pt states they eat at home vs going out to eat 50/50.   There were no vitals taken for this visit. There is no height or weight on file to calculate BMI.   Diabetes Self-Management Education - 10/31/22 1452       Visit Information   Visit Type First/Initial      Initial Visit   Diabetes Type Type 2    Date Diagnosed 07/26/22    Are you currently following a meal plan? No    Are you taking your medications as prescribed? Not on Medications      Health Coping   How would you rate your overall health? Good;Fair      Psychosocial Assessment   Patient Belief/Attitude about Diabetes Motivated to manage diabetes    What is the hardest part about your diabetes right now, causing you the most concern, or is the most  worrisome to you about your diabetes?   Making healty food and beverage choices    Self-care barriers None    Self-management support Doctor's office    Other persons present Patient    Patient Concerns Nutrition/Meal planning    Special Needs None    Preferred Learning Style No preference indicated    Learning Readiness Ready    How often do you need to have someone help you when you read instructions, pamphlets, or other written materials from your doctor or pharmacy? 1 - Never    What is the last grade level you completed in school? february 2024      Pre-Education Assessment   Patient understands the diabetes disease and treatment process. Needs Instruction    Patient understands incorporating nutritional management into lifestyle. Needs Instruction    Patient undertands incorporating physical activity into lifestyle. Needs Instruction    Patient understands using medications safely. Needs Instruction    Patient understands monitoring blood glucose, interpreting and using results Needs Instruction    Patient understands prevention, detection, and treatment of acute complications. Needs Instruction    Patient understands prevention, detection, and treatment of chronic complications. Needs Instruction    Patient understands how to develop strategies to address psychosocial issues. Needs Instruction    Patient understands how to develop strategies  to promote health/change behavior. Needs Instruction      Complications   Last HgB A1C per patient/outside source 7.6 %    How often do you check your blood sugar? 1-2 times/day    Fasting Blood glucose range (mg/dL) 16-109    Postprandial Blood glucose range (mg/dL) 604-540    Have you had a dilated eye exam in the past 12 months? Yes    Have you had a dental exam in the past 12 months? Yes    Are you checking your feet? Yes    How many days per week are you checking your feet? 4      Dietary Intake   Breakfast raw almonds and chewy  granola bar    Snack (morning) 10am: cheese stick OR banana OR apple OR peanut butter crackers    Lunch 12pm: salad OR piece of cheese pizza and apple    Snack (afternoon) vanilla chobani and granola and fruit    Dinner chickfila grilled chicken club and pretzels/chips OR home: chicken caesar wrap OR hamburgers OR lasagna and salad    Snack (evening) sugar free wafers and cup of milk    Beverage(s) 2 c coffee (zero sugar creamer), 24 oz water, occasional 1 cup low-fat milk or diet soda      Activity / Exercise   Activity / Exercise Type Light (walking / raking leaves)    How many days per week do you exercise? 3    How many minutes per day do you exercise? 45    Total minutes per week of exercise 135      Patient Education   Previous Diabetes Education No      Individualized Goals (developed by patient)   Nutrition General guidelines for healthy choices and portions discussed    Physical Activity Exercise 3-5 times per week    Medications Not Applicable    Monitoring  Test my blood glucose as discussed    Problem Solving Eating Pattern    Reducing Risk examine blood glucose patterns;do foot checks daily;treat hypoglycemia with 15 grams of carbs if blood glucose less than 70mg /dL    Health Coping Ask for help with psychological, social, or emotional issues      Post-Education Assessment   Patient understands the diabetes disease and treatment process. Comprehends key points    Patient understands incorporating nutritional management into lifestyle. Comprehends key points    Patient undertands incorporating physical activity into lifestyle. Comprehends key points    Patient understands using medications safely. Comphrehends key points    Patient understands monitoring blood glucose, interpreting and using results Comprehends key points    Patient understands prevention, detection, and treatment of acute complications. Comprehends key points    Patient understands prevention, detection,  and treatment of chronic complications. Comprehends key points    Patient understands how to develop strategies to address psychosocial issues. Comprehends key points    Patient understands how to develop strategies to promote health/change behavior. Comprehends key points      Outcomes   Expected Outcomes Demonstrated interest in learning. Expect positive outcomes    Future DMSE PRN    Program Status Completed             Individualized Plan for Diabetes Self-Management Training:   Learning Objective:  Patient will have a greater understanding of diabetes self-management. Patient education plan is to attend individual and/or group sessions per assessed needs and concerns.   Plan:   Patient Instructions  Aim for 150 minutes  of physical activity weekly. Goal: Exercise at least 4 days per week for at least 15 minutes.   Goal: aim to make 1/2 of your plate non-starchy vegetables at least 1x/day (at dinner meal).   Goal: aim to drink at least 4 of your 16 oz glasses of water per day.   When snacking, aim to include a complex carb and protein.   At meals, aim to include 1/4 plate of protein, 1/4 plate complex carb, and 1/2 plate non-starchy vegetables.   Expected Outcomes:  Demonstrated interest in learning. Expect positive outcomes  Education material provided: ADA - How to Thrive: A Guide for Your Journey with Diabetes, My Plate, and Snack sheet  If problems or questions, patient to contact team via:  Phone  Future DSME appointment: PRN

## 2023-02-18 ENCOUNTER — Telehealth: Payer: BC Managed Care – PPO | Admitting: Physician Assistant

## 2023-02-18 DIAGNOSIS — J019 Acute sinusitis, unspecified: Secondary | ICD-10-CM | POA: Diagnosis not present

## 2023-02-18 DIAGNOSIS — B9689 Other specified bacterial agents as the cause of diseases classified elsewhere: Secondary | ICD-10-CM | POA: Diagnosis not present

## 2023-02-18 DIAGNOSIS — R051 Acute cough: Secondary | ICD-10-CM

## 2023-02-18 MED ORDER — DOXYCYCLINE HYCLATE 100 MG PO TABS: 100.0000 mg | ORAL_TABLET | Freq: Two times a day (BID) | ORAL | 0 refills | Status: AC

## 2023-02-18 MED ORDER — BENZONATATE 100 MG PO CAPS: 100.0000 mg | ORAL_CAPSULE | Freq: Three times a day (TID) | ORAL | 0 refills | Status: AC | PRN

## 2023-02-18 NOTE — Progress Notes (Signed)

## 2023-03-27 ENCOUNTER — Other Ambulatory Visit: Payer: Self-pay | Admitting: Psychiatry

## 2023-03-27 DIAGNOSIS — F3181 Bipolar II disorder: Secondary | ICD-10-CM

## 2023-03-27 NOTE — Telephone Encounter (Signed)
/  Rf appropriate; lf 12/26/22; lv 06/25/22; no upcoming appt scheduled- needs appt. Changed quantity, and rf amount.

## 2023-03-27 NOTE — Telephone Encounter (Signed)
Please schedule appt

## 2023-03-28 NOTE — Telephone Encounter (Signed)
Lvm for pt to call and schedule

## 2023-04-18 ENCOUNTER — Other Ambulatory Visit: Payer: Self-pay | Admitting: Psychiatry

## 2023-04-18 DIAGNOSIS — F3181 Bipolar II disorder: Secondary | ICD-10-CM

## 2023-04-18 NOTE — Telephone Encounter (Signed)
Lf 10/16; lv 1/15 nv requested pt be scheduled.

## 2023-04-18 NOTE — Telephone Encounter (Signed)
Please schedule pt appt. Lv 06/25/22 was to fu 9 months.

## 2023-04-18 NOTE — Telephone Encounter (Signed)
Changed start date to 11/13

## 2023-05-25 ENCOUNTER — Other Ambulatory Visit: Payer: Self-pay | Admitting: Psychiatry

## 2023-05-25 DIAGNOSIS — F3181 Bipolar II disorder: Secondary | ICD-10-CM

## 2023-05-27 NOTE — Telephone Encounter (Signed)
Please schedule pt an appt. LV 06/25/22 FU due 9 months.

## 2023-06-14 NOTE — Telephone Encounter (Signed)
LM for pt to call to make an appt.

## 2023-06-22 ENCOUNTER — Other Ambulatory Visit: Payer: Self-pay | Admitting: Psychiatry

## 2023-06-22 DIAGNOSIS — F3181 Bipolar II disorder: Secondary | ICD-10-CM

## 2023-08-19 ENCOUNTER — Ambulatory Visit (INDEPENDENT_AMBULATORY_CARE_PROVIDER_SITE_OTHER): Payer: Self-pay | Admitting: Psychiatry

## 2023-08-19 ENCOUNTER — Encounter: Payer: Self-pay | Admitting: Psychiatry

## 2023-08-19 DIAGNOSIS — F3281 Premenstrual dysphoric disorder: Secondary | ICD-10-CM

## 2023-08-19 DIAGNOSIS — F3181 Bipolar II disorder: Secondary | ICD-10-CM

## 2023-08-19 MED ORDER — DIVALPROEX SODIUM 500 MG PO DR TAB: 1500.0000 mg | DELAYED_RELEASE_TABLET | Freq: Every day | ORAL | 3 refills | Status: DC

## 2023-08-19 NOTE — Progress Notes (Signed)
 Kayla Knox [id number] 03-27-1966 58 y.o.    Subjective:   Patient ID:  Kayla Knox is a 58 y.o. (DOB 1965/11/20) female.  Chief Complaint:  Chief Complaint  Patient presents with   Follow-up    HPI Kayla Knox presents to the office today for follow-up of bipolar disorder.  seen December 2019.  She had been somewhat inconsistent with Depakote taking between thousand and 1250 mg nightly.  1250 mg daily is the target dose.  She was encouraged to be more consistent. Still on lamotrigine 100  And rare sertraline 25 PRN PMDD.  05/2019 appt noted: Doing well except PMDD irritability and it's helpful.  Only takes a few. Plan: Change from sertraline to Lexapro 5 for PMDD DT GI SE.   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  Same without major changes in mood.   No SE.   Takes escitalopram 5 mg prn with some nausea.   Patient reports stable mood and denies depressed or irritable moods.  Patient denies any recent difficulty with anxiety.  Patient denies difficulty with sleep initiation or maintenance. Denies appetite disturbance.  Patient reports that energy and motivation have been good.  Patient denies any difficulty with concentration.  Patient denies any suicidal ideation. A little more moody with PMDD which is still a problem but escitalopram helps.  and perimenopausal with hot flashes. Plan no med changes  12/04/21 appt noted. She continues Depakote ER 1250 nightly but has stopped S-Citalopram and lamotrigine.  Off it early June to see if rash between breasts might resolve.  It has gone.  Had the rash for years. Overall mood is ok but some outbursts of extreme anger.  Stress with teen.  Hard to say if it is worse off the meds. Not depressed.  Sleeping ok with some awakening.  Night sweats. No SE Depakote Plan: BC worsening anger off lamotrigine, trial increase Depakote ER 1500 mg to deal with anger. Off  escitalopram bc N  06/25/2022 appointment noted: Increased Depakote ER 1500 mg daily and is much better.  No SE Mood is good.  Still has some hormonal problems.  She doesn't track but thinks monthly and sometimes situational with teenage D and health of M in law. Not much anxiety. Sleep overall is fine.  Some nights mind races and not tired infrequent. Usually tired from school. Plan no changes  08/19/23 appt noted:  Med: Depakote 500 mg tab 3 HS.  No SE.  Is 40 min away.   Overall great.  Some nights not sleeping great. Usually external as opposed to mind racing.  Typical night 7-8 hours.  Protective of sleep.  Dx DM can cause some irritability if low blood sugar.   Overall mood stability.   Uses lightbox in winter and it helps.  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    Past Psychiatric Medication Trials:  Depakote, lamotrigine 100 rash mild lithium thirsty and weight gain,  Abilify 15 lorazepam,  Lexapro 10 N, sertraline 25 N  Review of Systems:  Review of Systems  Respiratory:  Negative for cough and wheezing.   Gastrointestinal:  Negative for nausea.  Neurological:  Negative for tremors and weakness.  Psychiatric/Behavioral:  Negative for agitation, behavioral problems, confusion, decreased concentration, dysphoric mood, hallucinations, self-injury, sleep disturbance and suicidal ideas. The patient is not nervous/anxious and is not hyperactive.     Medications: I have  reviewed the patient's current medications.  Current Outpatient Medications  Medication Sig Dispense Refill   Albuterol Sulfate (PROAIR RESPICLICK) 108 (90 Base) MCG/ACT AEPB Inhale into the lungs.     benzonatate (TESSALON) 100 MG capsule Take 1 capsule (100 mg total) by mouth 3 (three) times daily as needed. 30 capsule 0   divalproex (DEPAKOTE) 500 MG DR tablet TAKE 3 TABLETS (1,500 MG TOTAL) BY MOUTH DAILY 90 tablet 1   doxycycline (VIBRA-TABS) 100 MG tablet Take 1  tablet (100 mg total) by mouth 2 (two) times daily. 20 tablet 0   fluticasone (FLONASE) 50 MCG/ACT nasal spray Place 1 spray into both nostrils daily.     fluticasone furoate-vilanterol (BREO ELLIPTA) 200-25 MCG/INH AEPB Inhale 1 puff into the lungs daily. 2 each 0   loratadine (CLARITIN) 10 MG tablet Take 10 mg by mouth daily.     metFORMIN (GLUCOPHAGE) 500 MG tablet Take 500 mg by mouth 2 (two) times daily with a meal.     No current facility-administered medications for this visit.    Medication Side Effects: None , nausea and diarrhea with Sertraline Allergies:  Allergies  Allergen Reactions   Promethazine Other (See Comments)    Seizure, hospitalized because of this medication    Azithromycin Rash    Past Medical History:  Diagnosis Date   Arthritis    Asthma    Bipolar 1 disorder (HCC)    Migraines    Seasonal allergies     Family History  Problem Relation Age of Onset   Colon cancer Maternal Uncle     Social History   Socioeconomic History   Marital status: Married    Spouse name: Not on file   Number of children: Not on file   Years of education: Not on file   Highest education level: Not on file  Occupational History   Not on file  Tobacco Use   Smoking status: Former   Smokeless tobacco: Never  Substance and Sexual Activity   Alcohol use: No    Alcohol/week: 0.0 standard drinks of alcohol   Drug use: No   Sexual activity: Not on file  Other Topics Concern   Not on file  Social History Narrative   Not on file   Social Drivers of Health   Financial Resource Strain: Low Risk  (07/26/2022)   Received from [facility], [facility]   Overall Financial Resource Strain (CARDIA)    Difficulty of Paying Living Expenses: Not hard at all  Food Insecurity: No Food Insecurity (07/26/2022)   Received from [facility], [facility]   Hunger Vital Sign    Worried About Running Out of Food in  the Last Year: Never true    Ran Out of Food in the Last Year: Never true  Transportation Needs: No Transportation Needs (07/26/2022)   Received from [facility], [facility]   [facility] - Transportation    In the past 12 months, has lack of transportation kept you from medical appointments or from getting medications?: No    Lack of Transportation (Non-Medical): No  Physical Activity: Not on file  Stress: Not on file  Social Connections: Not on file  Intimate Partner Violence: Not on file    Past Medical History, Surgical history, Social history, and Family history were reviewed and updated as appropriate.   Please see review of systems for further details on the patient's review from today.   Objective:  Physical Exam:  There were no vitals taken for this visit.  Physical Exam Constitutional:      General: She is not in acute distress.    Appearance: She is well-developed.  Musculoskeletal:        General: No deformity.  Neurological:     Mental Status: She is alert and oriented to person, place, and time.     Motor: No tremor.     Coordination: Coordination normal.     Gait: Gait normal.  Psychiatric:        Attention and Perception: Attention and perception normal.        Mood and Affect: Mood is not anxious or depressed. Affect is not labile, blunt, angry or inappropriate.        Speech: Speech normal. Speech is not rapid and pressured.        Behavior: Behavior normal.        Thought Content: Thought content normal. Thought content is not delusional. Thought content does not include homicidal or suicidal ideation. Thought content does not include suicidal plan.        Cognition and Memory: Cognition normal.        Judgment: Judgment normal.     Comments: Insight intact. No auditory or visual hallucinations. No delusions.      Lab Review:     Component Value Date/Time   NA 138 10/01/2020 0240   K 4.1 10/01/2020 0240   CL 101  10/01/2020 0240   CO2 25 10/01/2020 0240   GLUCOSE 112 (H) 10/01/2020 0240   BUN 14 10/01/2020 0240   CREATININE 0.76 10/01/2020 0240   CALCIUM 9.4 10/01/2020 0240   GFRNONAA >60 10/01/2020 0240   GFRAA >60 12/15/2018 0316       Component Value Date/Time   WBC 8.8 10/01/2020 0240   RBC 4.43 10/01/2020 0240   HGB 12.9 10/01/2020 0240   HCT 38.7 10/01/2020 0240   PLT 191 10/01/2020 0240   MCV 87.4 10/01/2020 0240   MCH 29.1 10/01/2020 0240   MCHC 33.3 10/01/2020 0240   RDW 12.3 10/01/2020 0240    No results found for: "POCLITH", "LITHIUM"   No results found for: "PHENYTOIN", "PHENOBARB", "VALPROATE", "CBMZ"   .res Assessment: Plan:    Bipolar II disorder (HCC)  PMDD (premenstrual dysphoric disorder)   25 min face to face time with patient was spent on counseling and coordination of care. We discussed historically she has done well with dosage Depakote ER 1250 historically in combination with lamotrigine.  However she had to stop lamotrigine due to a mild persistent rash.  The rash resolved off lamotrigine..  More into irritability and anger outbursts lately perhaps exacerbated by stopping lamotrigine. Better with Prior blood levels on the 1250 produced a valproic acid level of 76.   dose increase of Depakote ER 1500 mg to deal with anger   Off escitalopram bc N Disc option of other SSRI for PMDD.  Disc this in detail.  She might use this still occ.  Option retry lorazepam.  She does not want med change  Uses lightbox in winter and it helps  Disc SE each med.  Could recheck level but probably unnecessarey given the absence of side effects and that Depakote levels remain fairly stable and people of middle age.  FU 12 mos bc stable and distance.    Meredith Staggers, MD, DFAPA  Please see After Visit Summary for patient specific instructions.  No future appointments.  No orders of the defined types  were placed in this encounter.     -------------------------------

## 2023-12-27 ENCOUNTER — Encounter: Payer: Self-pay | Admitting: Advanced Practice Midwife

## 2024-01-12 ENCOUNTER — Other Ambulatory Visit: Payer: Self-pay | Admitting: Psychiatry

## 2024-01-12 DIAGNOSIS — F3181 Bipolar II disorder: Secondary | ICD-10-CM

## 2024-07-09 ENCOUNTER — Other Ambulatory Visit: Payer: Self-pay | Admitting: Psychiatry

## 2024-07-09 DIAGNOSIS — F3181 Bipolar II disorder: Secondary | ICD-10-CM

## 2024-07-17 ENCOUNTER — Encounter: Payer: Self-pay | Admitting: Student in an Organized Health Care Education/Training Program

## 2024-07-17 ENCOUNTER — Ambulatory Visit: Admitting: Student in an Organized Health Care Education/Training Program

## 2024-07-17 VITALS — BP 110/68 | HR 89 | Temp 98.7°F | Ht 61.0 in | Wt 130.2 lb

## 2024-07-17 DIAGNOSIS — J4540 Moderate persistent asthma, uncomplicated: Secondary | ICD-10-CM

## 2024-07-17 DIAGNOSIS — R053 Chronic cough: Secondary | ICD-10-CM

## 2024-07-17 LAB — NITRIC OXIDE: Nitric Oxide: 23

## 2024-07-17 NOTE — Patient Instructions (Signed)
 Today, I ordered blood work. You can get them draw at your preferred [facility] draw station. The nearest one to [facility] is at nearby [facility] ([address] [place], [place], [place] [address]).

## 2024-07-17 NOTE — Progress Notes (Signed)
 "  Synopsis: Referred in for chronic cough and asthma by Saylor, Brislyn K, PA-C  Assessment & Plan:   #Chronic cough  She has experienced a persistent cough for several years with an underlying diagnosis of asthma managed with ICS/LABA/LAMA (trelegy) and consideration for biologic therapy.  A previous chest x-ray was normal. Differential diagnosis includes poorly controlled asthma, gastroesophageal reflux disease resulting in cough and asthma, allergic bronchopulmonary aspergillosis (ABPA), bronchiectasis, and ILD. No wheezing is present on examination, but rhonchi are noted.   Previous pulmonary function tests were normal in 2021. Will order a CT scan of the chest to evaluate for underlying lung pathology, along with a full pulmonary function test to assess lung function (lung volumes, reversible obstruction, DLCO measurement). IgE ordered by her allergist was normal, so we will not repeat this, but will order aspergillus IgG levels to assess for ABPA. Will also order a swallowing study is ordered to assess for esophageal issues contributing to the cough.   - Nitric oxide indeterminate at 23 ppb - Pulmonary Function Test; Future - CT CHEST HIGH RESOLUTION; Future - Allergen Panel (27) + IGE; Future - Aspergillus fumigatus IgG; Future - DG ESOPHAGUS W DOUBLE CM (HD); Future  #Asthma    Diagnosed as an adult, her asthma is currently managed with Trelegy and Flonase. Tezepelumab is being considered and planned to start in the next couple of weeks. Symptoms include cough and wheezing, with a history of requiring prednisone  for exacerbations. Will evaluate for mimickers of asthma that can be contributing to her symptoms (see above) before having her proceed with biologic therapy.  Lab Results  Component Value Date   NITRICOXIDE 23 07/17/2024   This result suggests intermediate (25-49) Type 2 (T2) airway inflammation; clinical correlation required.   #Gastroesophageal reflux disease     Suspected to contribute to the chronic cough, symptoms include worsening cough at night and with certain foods. A previous change in reflux medication improved symptoms but did not resolve them. Referral to gastroenterology for further evaluation is pending (placed by PCP and allergist). Continue current reflux management and await gastroenterology evaluation for further management.   - dietary modifications recommended - GERD consistent diet   Return in about 4 weeks (around 08/14/2024).  Belva November, MD [facility]  I spent 60 minutes caring for this patient today, including preparing to see the patient, obtaining a medical history , reviewing a separately obtained history, performing a medically appropriate examination and/or evaluation, counseling and educating the patient/family/caregiver, ordering medications, tests, or procedures, documenting clinical information in the electronic health record, and independently interpreting results (not separately reported/billed) and communicating results to the patient/family/caregiver  End of visit medications:  No orders of the defined types were placed in this encounter.   Current Medications[1]   Subjective:   PATIENT ID: Kayla Knox GENDER: female DOB: 1965-09-20, MRN: [id number]  Chief Complaint  Patient presents with   Cough    Cough with yellow sputum for years. No SOB. Wheezing at night. Has been diagnosed with Asthma. Trelegy- 200mg   daily. Flonase. Xyzal.    HPI  Discussed the use of AI scribe software for clinical note transcription with the patient, who gave verbal consent to proceed.  Kayla Knox is a 59 year old female with asthma who presents with a chronic cough. She was referred by her allergist for evaluation of her chronic cough and asthma management.  She has experienced a chronic cough that has worsened since 2021. The cough  is productive with phlegm and is triggered by  environmental factors such as certain smells, rain, wind, cold weather, and lying down at night. It occurs both during the day and at night, worsening when she lies down. She also experiences hoarseness and wheezing, particularly at night. No chest pain, shortness of breath, or palpitations.  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 It is managed with Trelegy and Flonase. She has been on inhaler therapy and has had previous pulmonary function tests (PFTs) in 2021 showing normal total lung capacity (TLC) and spirometry. Recent PFTs were performed with a small tool in the office at her allergist but I do not have access to these. She is being considered for the initiation of tezepelumab.  She has symptoms of gastroesophageal reflux disease (GERD), which have been partially managed with a change in reflux, resulting in some improvement of her cough. She is awaiting a gastroenterology appointment to further evaluate the potential contribution of reflux to her symptoms.  Her family history includes a father who experiences asthma flares when sick. She has no siblings or children with asthma. Socially, she is a high engineer, site and reports that certain times of the year, such as fall and spring, exacerbate her symptoms due to environmental allergens like mold and mildew from fallen leaves.  She has allergies to dust mites, cockroaches, and some pollens, and she has two dogs and one cat, with one dog shedding significantly. She quit smoking at age 20 after smoking for 10 years. She drinks two cups of coffee daily and mostly drinks water, avoiding acidic beverages due to her reflux.      Ancillary information including prior medications, full medical/surgical/family/social histories, and PFTs (when available) are listed below and have been reviewed.     Review of Systems  Constitutional:  Negative for chills, fever and weight  loss.  Respiratory:  Positive for cough. Negative for hemoptysis, sputum production, shortness of breath and wheezing.   Cardiovascular:  Negative for chest pain.     Objective:   Vitals:   07/17/24 0853  BP: 110/68  Pulse: 89  Temp: 98.7 F (37.1 C)  SpO2: 98%  Weight: 130 lb 3.2 oz (59.1 kg)  Height: 5' 1 (1.549 m)   98% on RA  BMI Readings from Last 3 Encounters:  07/17/24 24.60 kg/m  10/01/20 27.96 kg/m  01/05/20 27.96 kg/m   Wt Readings from Last 3 Encounters:  07/17/24 130 lb 3.2 oz (59.1 kg)  10/01/20 148 lb (67.1 kg)  01/05/20 148 lb (67.1 kg)    Physical Exam Constitutional:      Appearance: Normal appearance.  Cardiovascular:     Rate and Rhythm: Normal rate and regular rhythm.     Pulses: Normal pulses.     Heart sounds: Normal heart sounds.  Pulmonary:     Effort: Pulmonary effort is normal.     Breath sounds: Rhonchi present. No wheezing or rales.  Neurological:     General: No focal deficit present.     Mental Status: She is alert and oriented to person, place, and time. Mental status is at baseline.     Ancillary Information    Past Medical History:  Diagnosis Date   Arthritis    Asthma    Bipolar 1 disorder (HCC)    Migraines    Seasonal allergies      Family History  Problem Relation Age of Onset   Colon  cancer Maternal Uncle      Past Surgical History:  Procedure Laterality Date   ABLATION     KNEE SURGERY      Social History   Socioeconomic History   Marital status: Married    Spouse name: Not on file   Number of children: Not on file   Years of education: Not on file   Highest education level: Not on file  Occupational History   Not on file  Tobacco Use   Smoking status: Former    Types: Cigarettes   Smokeless tobacco: Never   Tobacco comments:    Quit smoking at 59 years old.     Smoked 0.25 PPD at her heaviest  Substance and Sexual Activity   Alcohol use: No    Alcohol/week: 0.0 standard drinks of  alcohol   Drug use: No   Sexual activity: Not on file  Other Topics Concern   Not on file  Social History Narrative   Not on file   Social Drivers of Health   Tobacco Use: Medium Risk (07/17/2024)   Patient History    Smoking Tobacco Use: Former    Smokeless Tobacco Use: Never    Passive Exposure: Not on file  Financial Resource Strain: Low Risk  (06/30/2024)   Received from [facility]   Overall Financial Resource Strain (CARDIA)    Difficulty of Paying Living Expenses: Not hard at all  Food Insecurity: No Food Insecurity (06/30/2024)   Received from [facility]   Epic    Within the past 12 months, you worried that your food would run out before you got the money to buy more.: Never true    Within the past 12 months, the food you bought just didn't last and you didn't have money to get more.: Never true  Transportation Needs: No Transportation Needs (06/30/2024)   Received from [facility] - Transportation    In the past 12 months, has lack of transportation kept you from medical appointments or from getting medications?: No    Lack of Transportation (Non-Medical): No  Physical Activity: Not on file  Stress: Not on file  Social Connections: Not on file  Intimate Partner Violence: Not on file  Depression (PHQ2-9): Low Risk (10/31/2022)   Depression (PHQ2-9)    PHQ-2 Score: 0  Alcohol Screen: Not on file  Housing: Low Risk  (06/30/2024)   Received from [facility]   Epic    In the last 12 months, was there a time when you were not able to pay the mortgage or rent on time?: No    In the past 12 months, how many times have you moved where you were living?: 0    At any time in the past 12 months, were you homeless or living in a shelter (including now)?: No  Utilities: Not At Risk (06/30/2024)   Received from [facility]   Epic    In the past 12 months has the electric, gas, oil, or  water company threatened to shut off services in your home?: No  Health Literacy: Not on file     Allergies[2]   CBC    Component Value Date/Time   WBC 8.8 10/01/2020 0240   RBC 4.43 10/01/2020 0240   HGB 12.9 10/01/2020 0240   HCT 38.7 10/01/2020 0240   PLT 191 10/01/2020 0240   MCV 87.4 10/01/2020 0240   MCH 29.1 10/01/2020 0240  MCHC 33.3 10/01/2020 0240   RDW 12.3 10/01/2020 0240    Pulmonary Functions Testing Results:    Latest Ref Rng & Units 12/24/2019   11:07 AM  PFT Results  FVC-Pre L 2.42   FVC-Predicted Pre % 75   FVC-Post L 2.36   FVC-Predicted Post % 73   Pre FEV1/FVC % % 84   Post FEV1/FCV % % 84   FEV1-Pre L 2.03   FEV1-Predicted Pre % 79   FEV1-Post L 2.00   DLCO uncorrected ml/min/mmHg 23.47   DLCO UNC% % 122   DLCO corrected ml/min/mmHg 23.47   DLCO COR %Predicted % 122   DLVA Predicted % 134   TLC L 4.34   TLC % Predicted % 92   RV % Predicted % 116     Outpatient Medications Prior to Visit  Medication Sig Dispense Refill   divalproex  (DEPAKOTE ) 500 MG DR tablet TAKE 3 TABLETS (1,500 MG TOTAL) BY MOUTH DAILY 180 tablet 0   fluticasone (FLONASE) 50 MCG/ACT nasal spray Place 1 spray into both nostrils daily.     levocetirizine (XYZAL) 5 MG tablet Take 5 mg by mouth every evening.     metFORMIN (GLUCOPHAGE) 500 MG tablet Take 500 mg by mouth 2 (two) times daily with a meal.     MOUNJARO 2.5 MG/0.5ML Pen Inject 2.5 mg into the skin once a week.     pantoprazole (PROTONIX) 40 MG tablet Take 40 mg by mouth daily.     TRELEGY ELLIPTA 200-62.5-25 MCG/ACT AEPB Inhale 1 puff into the lungs daily.     Albuterol  Sulfate (PROAIR  RESPICLICK) 108 (90 Base) MCG/ACT AEPB Inhale into the lungs. (Patient not taking: Reported on 07/17/2024)     benzonatate  (TESSALON ) 100 MG capsule Take 1 capsule (100 mg total) by mouth 3 (three) times daily as needed. (Patient not taking: Reported on 07/17/2024) 30 capsule 0   doxycycline  (VIBRA -TABS) 100 MG tablet Take 1 tablet  (100 mg total) by mouth 2 (two) times daily. (Patient not taking: Reported on 07/17/2024) 20 tablet 0   fluticasone furoate-vilanterol (BREO ELLIPTA ) 200-25 MCG/INH AEPB Inhale 1 puff into the lungs daily. (Patient not taking: Reported on 07/17/2024) 2 each 0   loratadine (CLARITIN) 10 MG tablet Take 10 mg by mouth daily. (Patient not taking: Reported on 07/17/2024)     No facility-administered medications prior to visit.      [1]  Current Outpatient Medications:    divalproex  (DEPAKOTE ) 500 MG DR tablet, TAKE 3 TABLETS (1,500 MG TOTAL) BY MOUTH DAILY, Disp: 180 tablet, Rfl: 0   fluticasone (FLONASE) 50 MCG/ACT nasal spray, Place 1 spray into both nostrils daily., Disp: , Rfl:    levocetirizine (XYZAL) 5 MG tablet, Take 5 mg by mouth every evening., Disp: , Rfl:    metFORMIN (GLUCOPHAGE) 500 MG tablet, Take 500 mg by mouth 2 (two) times daily with a meal., Disp: , Rfl:    MOUNJARO 2.5 MG/0.5ML Pen, Inject 2.5 mg into the skin once a week., Disp: , Rfl:    pantoprazole (PROTONIX) 40 MG tablet, Take 40 mg by mouth daily., Disp: , Rfl:    TRELEGY ELLIPTA 200-62.5-25 MCG/ACT AEPB, Inhale 1 puff into the lungs daily., Disp: , Rfl:    Albuterol  Sulfate (PROAIR  RESPICLICK) 108 (90 Base) MCG/ACT AEPB, Inhale into the lungs. (Patient not taking: Reported on 07/17/2024), Disp: , Rfl:    benzonatate  (TESSALON ) 100 MG capsule, Take 1 capsule (100 mg total) by mouth 3 (three) times daily as needed. (Patient  not taking: Reported on 07/17/2024), Disp: 30 capsule, Rfl: 0   doxycycline  (VIBRA -TABS) 100 MG tablet, Take 1 tablet (100 mg total) by mouth 2 (two) times daily. (Patient not taking: Reported on 07/17/2024), Disp: 20 tablet, Rfl: 0   fluticasone furoate-vilanterol (BREO ELLIPTA ) 200-25 MCG/INH AEPB, Inhale 1 puff into the lungs daily. (Patient not taking: Reported on 07/17/2024), Disp: 2 each, Rfl: 0   loratadine (CLARITIN) 10 MG tablet, Take 10 mg by mouth daily. (Patient not taking: Reported on 07/17/2024), Disp: ,  Rfl:  [2]  Allergies Allergen Reactions   Promethazine Other (See Comments)    Seizure, hospitalized because of this medication    Azithromycin Rash   "

## 2024-07-23 ENCOUNTER — Ambulatory Visit

## 2024-07-23 ENCOUNTER — Other Ambulatory Visit

## 2024-08-11 ENCOUNTER — Encounter

## 2024-08-12 ENCOUNTER — Ambulatory Visit: Admitting: Student in an Organized Health Care Education/Training Program

## 2024-08-18 ENCOUNTER — Ambulatory Visit: Admitting: Psychiatry
